# Patient Record
Sex: Female | Born: 1975 | Race: White | Hispanic: No | Marital: Single | Smoking: Never smoker
Health system: Southern US, Community
[De-identification: ages and names within clinical notes are randomized; demographics above are authoritative.]

## PROBLEM LIST (undated history)

## (undated) DIAGNOSIS — K5090 Crohn's disease, unspecified, without complications: Secondary | ICD-10-CM

## (undated) DIAGNOSIS — K219 Gastro-esophageal reflux disease without esophagitis: Secondary | ICD-10-CM

## (undated) DIAGNOSIS — D649 Anemia, unspecified: Secondary | ICD-10-CM

## (undated) DIAGNOSIS — M2550 Pain in unspecified joint: Secondary | ICD-10-CM

## (undated) DIAGNOSIS — R51 Headache: Secondary | ICD-10-CM

## (undated) DIAGNOSIS — T7840XA Allergy, unspecified, initial encounter: Secondary | ICD-10-CM

## (undated) HISTORY — DX: Crohn's disease, unspecified, without complications: K50.90

## (undated) HISTORY — DX: Anemia, unspecified: D64.9

## (undated) HISTORY — DX: Allergy, unspecified, initial encounter: T78.40XA

---

## 1998-05-17 ENCOUNTER — Other Ambulatory Visit: Admission: RE | Admit: 1998-05-17 | Discharge: 1998-05-17 | Payer: Self-pay | Admitting: Family Medicine

## 2001-05-20 ENCOUNTER — Encounter: Payer: Self-pay | Admitting: Emergency Medicine

## 2001-05-20 ENCOUNTER — Emergency Department (HOSPITAL_COMMUNITY): Admission: EM | Admit: 2001-05-20 | Discharge: 2001-05-20 | Payer: Self-pay | Admitting: Emergency Medicine

## 2009-02-25 ENCOUNTER — Inpatient Hospital Stay (HOSPITAL_COMMUNITY): Admission: AD | Admit: 2009-02-25 | Discharge: 2009-02-25 | Payer: Self-pay | Admitting: Obstetrics and Gynecology

## 2009-02-27 ENCOUNTER — Other Ambulatory Visit: Admission: RE | Admit: 2009-02-27 | Discharge: 2009-02-27 | Payer: Self-pay | Admitting: Obstetrics and Gynecology

## 2009-03-02 ENCOUNTER — Inpatient Hospital Stay (HOSPITAL_COMMUNITY): Admission: AD | Admit: 2009-03-02 | Discharge: 2009-03-02 | Payer: Self-pay | Admitting: Obstetrics and Gynecology

## 2009-03-02 ENCOUNTER — Ambulatory Visit: Payer: Self-pay | Admitting: Family Medicine

## 2009-08-28 ENCOUNTER — Ambulatory Visit: Payer: Self-pay | Admitting: Obstetrics and Gynecology

## 2009-08-28 ENCOUNTER — Inpatient Hospital Stay (HOSPITAL_COMMUNITY): Admission: AD | Admit: 2009-08-28 | Discharge: 2009-09-01 | Payer: Self-pay | Admitting: Obstetrics and Gynecology

## 2009-12-28 ENCOUNTER — Other Ambulatory Visit: Admission: RE | Admit: 2009-12-28 | Discharge: 2009-12-28 | Payer: Self-pay | Admitting: Obstetrics and Gynecology

## 2010-04-27 IMAGING — US US OB COMP LESS 14 WK
1 series · 13 of 13 positions shown · non-contrast
Comparison: None

CLINICAL DATA: 11 weeks pregnancy.  Spotting.

OBSTETRIC <14 WK ULTRASOUND
TECHNIQUE: Transabdominal ultrasound was performed for evaluation
of the gestation as well as the maternal uterus and adnexal
regions.

[Series 1: us ob comp less 14 wks · 13 of 13 slices shown]
[im 1/13]
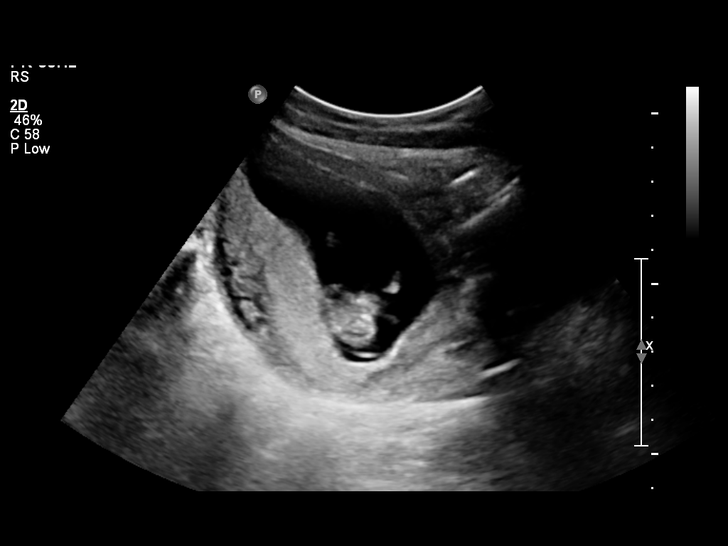
[im 2/13]
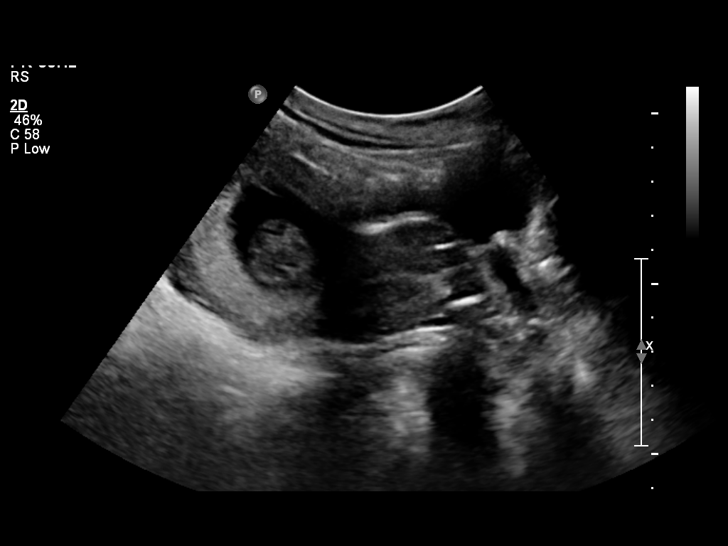
[im 3/13]
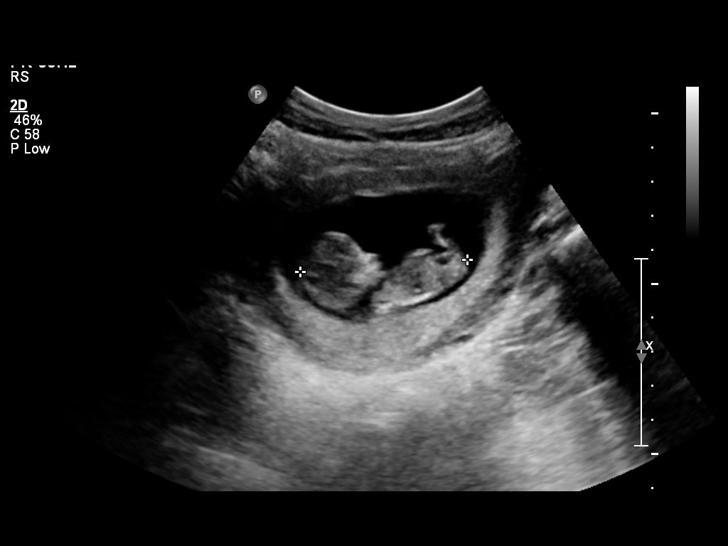
[im 4/13]
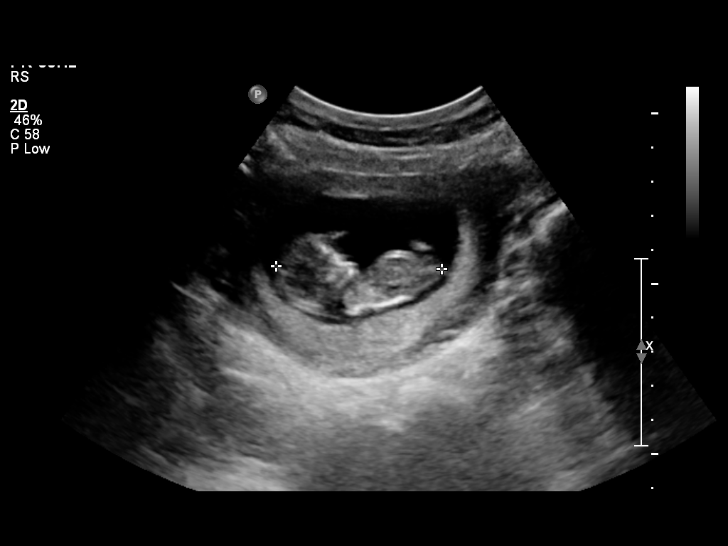
[im 5/13]
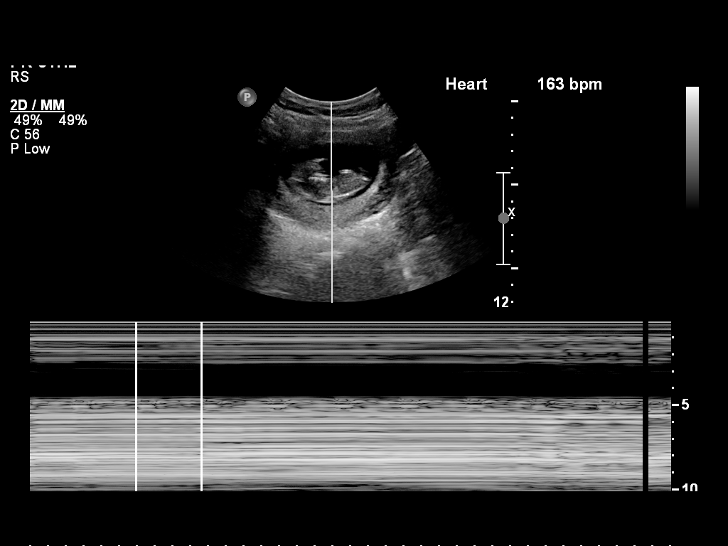
[im 6/13]
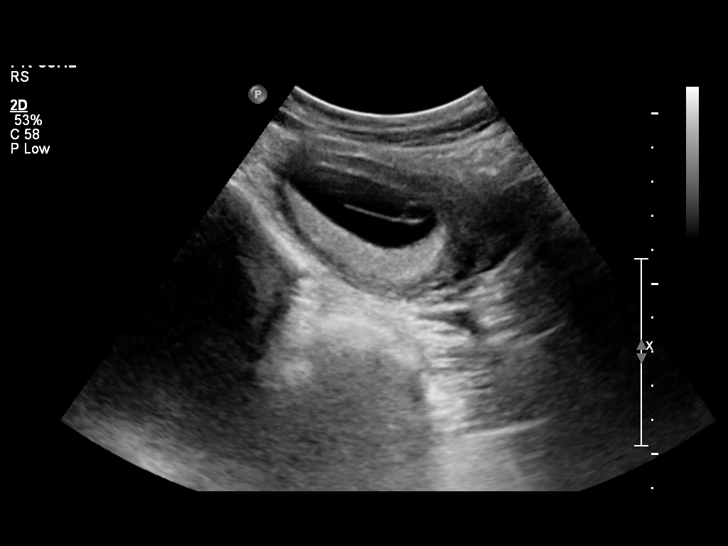
[im 7/13]
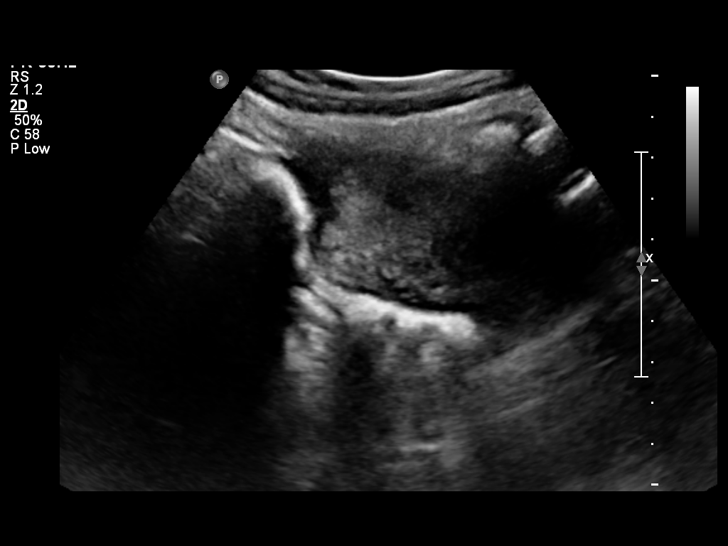
[im 8/13]
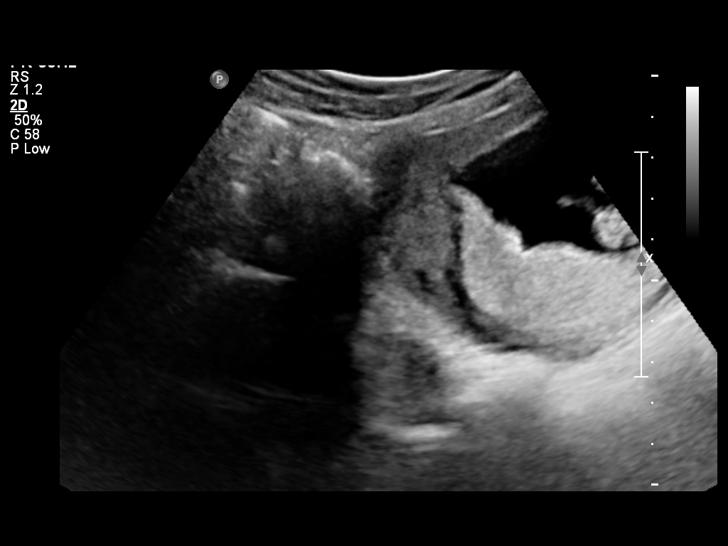
[im 9/13]
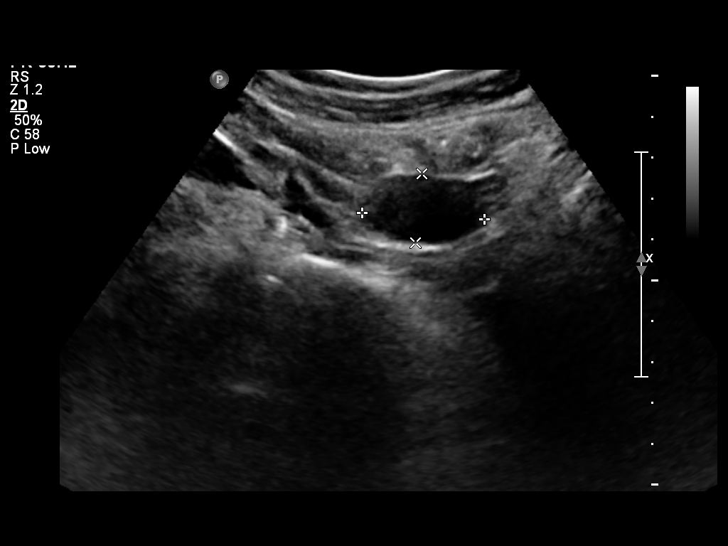
[im 10/13]
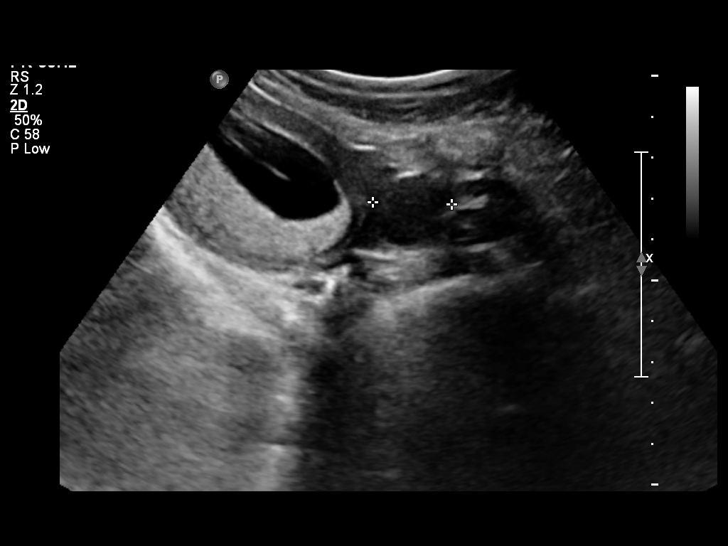
[im 11/13]
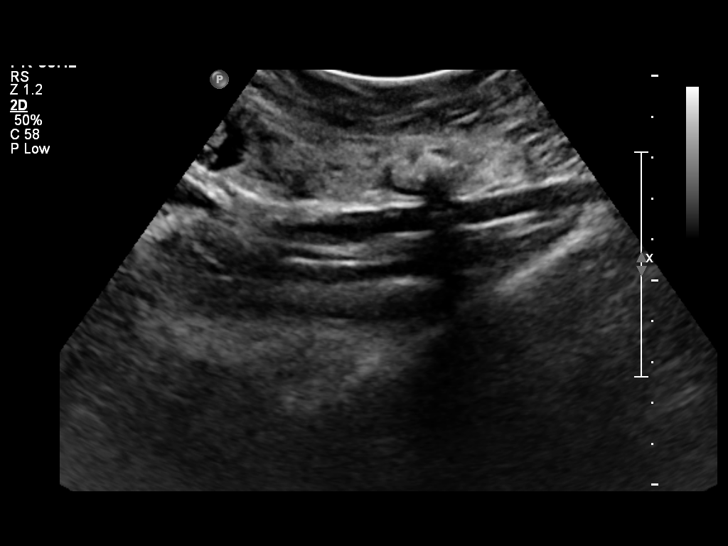
[im 12/13]
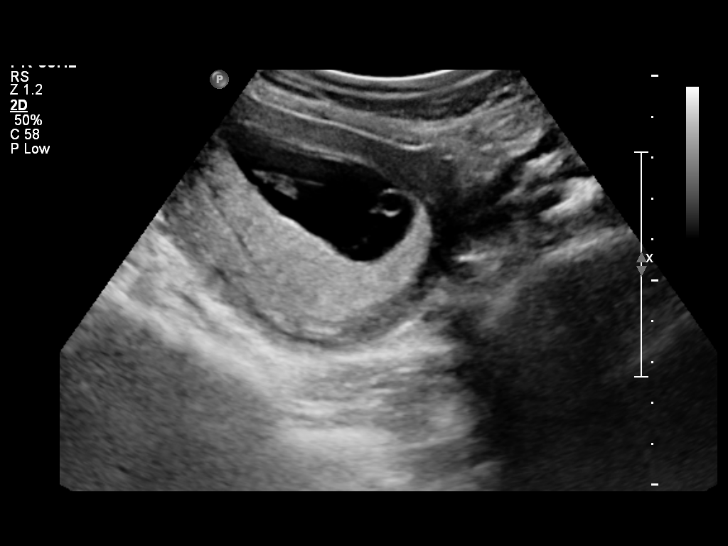
[im 13/13]
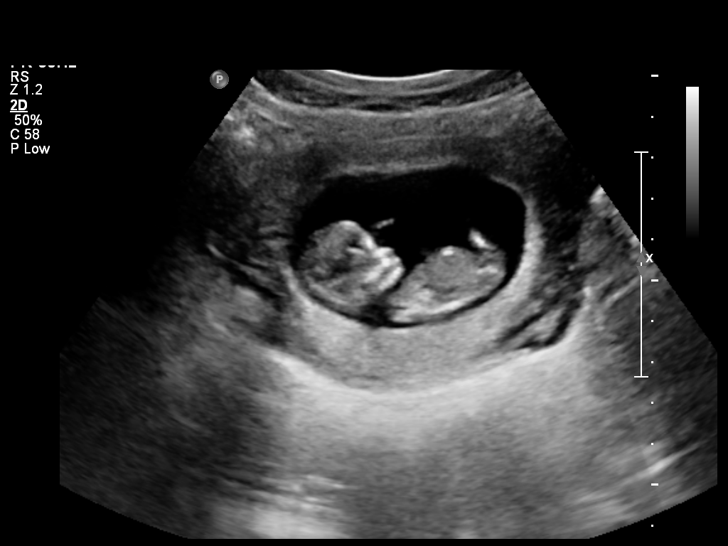

[13 of 13 positions shown; findings below may reference images not displayed]

FINDINGS: A single living intrauterine pregnancy is noted with
crown-rump length of 4.8 cm compatible with 11 weeks 5 days
gestation, and with cardiac activity at 163 beats per minute.  A
yolk sac is identified along the margin of the amnion.  The amount
of fluid appears appropriate.

No subchorionic hemorrhage is identified.  No free pelvic fluid is
noted.

The left ovary measures 3.0 x 1.7 x 1.9 cm.  The right ovary cannot
be well visualized on today's exam.
IMPRESSION: 1.  Single living intrauterine pregnancy measuring at 11 weeks 5
days gestation.  No subchorionic hemorrhage or specific
complicating feature is identified sonographically.

## 2010-06-28 ENCOUNTER — Other Ambulatory Visit: Admission: RE | Admit: 2010-06-28 | Discharge: 2010-06-28 | Payer: Self-pay | Admitting: Obstetrics and Gynecology

## 2011-02-28 ENCOUNTER — Other Ambulatory Visit (HOSPITAL_COMMUNITY)
Admission: RE | Admit: 2011-02-28 | Discharge: 2011-02-28 | Disposition: A | Discharge status: Home / Self Care | Payer: Self-pay | Source: Ambulatory Visit | Attending: Obstetrics and Gynecology | Admitting: Obstetrics and Gynecology

## 2011-02-28 ENCOUNTER — Other Ambulatory Visit: Payer: Self-pay | Admitting: Obstetrics and Gynecology

## 2011-02-28 DIAGNOSIS — Z01419 Encounter for gynecological examination (general) (routine) without abnormal findings: Secondary | ICD-10-CM | POA: Insufficient documentation

## 2011-03-22 LAB — COMPREHENSIVE METABOLIC PANEL
ALT: 8 U/L (ref 0–35)
ALT: 9 U/L (ref 0–35)
AST: 25 U/L (ref 0–37)
AST: 26 U/L (ref 0–37)
Albumin: 2.8 g/dL — ABNORMAL LOW (ref 3.5–5.2)
Alkaline Phosphatase: 214 U/L — ABNORMAL HIGH (ref 39–117)
BUN: 11 mg/dL (ref 6–23)
BUN: 15 mg/dL (ref 6–23)
CO2: 20 mEq/L (ref 19–32)
Calcium: 8.3 mg/dL — ABNORMAL LOW (ref 8.4–10.5)
Calcium: 9 mg/dL (ref 8.4–10.5)
Calcium: 9 mg/dL (ref 8.4–10.5)
Chloride: 106 mEq/L (ref 96–112)
Creatinine, Ser: 0.89 mg/dL (ref 0.4–1.2)
Creatinine, Ser: 0.91 mg/dL (ref 0.4–1.2)
Creatinine, Ser: 0.92 mg/dL (ref 0.4–1.2)
GFR calc Af Amer: >60 mL/min (ref >60)
GFR calc Af Amer: >60 mL/min (ref >60)
GFR calc non Af Amer: >60 mL/min (ref >60)
Glucose, Bld: 65 mg/dL — ABNORMAL LOW (ref 70–99)
Glucose, Bld: 67 mg/dL — ABNORMAL LOW (ref 70–99)
Glucose, Bld: 75 mg/dL (ref 70–99)
Potassium: 3.8 mEq/L (ref 3.5–5.1)
Sodium: 133 mEq/L — ABNORMAL LOW (ref 135–145)
Sodium: 135 mEq/L (ref 135–145)
Total Bilirubin: 0.3 mg/dL (ref 0.3–1.2)
Total Protein: 4.8 g/dL — ABNORMAL LOW (ref 6.0–8.3)
Total Protein: 6.3 g/dL (ref 6.0–8.3)
Total Protein: 6.3 g/dL (ref 6.0–8.3)

## 2011-03-22 LAB — DIFFERENTIAL
Eosinophils Absolute: 0 10*3/uL (ref 0.0–0.7)
Lymphocytes Relative: 12 % (ref 12–46)
Lymphs Abs: 1.5 10*3/uL (ref 0.7–4.0)
Monocytes Relative: 7 % (ref 3–12)
Neutrophils Relative %: 80 % — ABNORMAL HIGH (ref 43–77)

## 2011-03-22 LAB — CBC
HCT: 28.1 % — ABNORMAL LOW (ref 36.0–46.0)
HCT: 31.2 % — ABNORMAL LOW (ref 36.0–46.0)
Hemoglobin: 10.7 g/dL — ABNORMAL LOW (ref 12.0–15.0)
Hemoglobin: 9.6 g/dL — ABNORMAL LOW (ref 12.0–15.0)
MCHC: 34 g/dL (ref 30.0–36.0)
MCHC: 34.1 g/dL (ref 30.0–36.0)
MCHC: 34.2 g/dL (ref 30.0–36.0)
MCV: 84.2 fL (ref 78.0–100.0)
MCV: 84.7 fL (ref 78.0–100.0)
MCV: 85.2 fL (ref 78.0–100.0)
Platelets: 128 10*3/uL — ABNORMAL LOW (ref 150–400)
RBC: 3.71 MIL/uL — ABNORMAL LOW (ref 3.87–5.11)
RDW: 13.6 % (ref 11.5–15.5)
RDW: 13.8 % (ref 11.5–15.5)
RDW: 14.1 % (ref 11.5–15.5)
WBC: 7.6 10*3/uL (ref 4.0–10.5)

## 2011-03-22 LAB — URINALYSIS, MICROSCOPIC ONLY
Ketones, ur: NEGATIVE mg/dL
Protein, ur: NEGATIVE mg/dL
Urobilinogen, UA: 0.2 mg/dL (ref 0.0–1.0)

## 2011-03-22 LAB — RH IMMUNE GLOB WKUP(>/=20WKS)(NOT WOMEN'S HOSP): Fetal Screen: NEGATIVE

## 2011-03-22 LAB — RPR: RPR Ser Ql: NONREACTIVE

## 2011-03-28 LAB — COMPREHENSIVE METABOLIC PANEL
Albumin: 4.1 g/dL (ref 3.5–5.2)
Alkaline Phosphatase: 37 U/L — ABNORMAL LOW (ref 39–117)
BUN: 6 mg/dL (ref 6–23)
CO2: 24 mEq/L (ref 19–32)
Chloride: 103 mEq/L (ref 96–112)
Creatinine, Ser: 0.72 mg/dL (ref 0.4–1.2)
GFR calc non Af Amer: >60 mL/min (ref >60)
Glucose, Bld: 81 mg/dL (ref 70–99)
Potassium: 3.7 mEq/L (ref 3.5–5.1)
Total Bilirubin: 0.8 mg/dL (ref 0.3–1.2)

## 2011-03-28 LAB — RH IMMUNE GLOBULIN WORKUP (NOT WOMEN'S HOSP): Antibody Screen: NEGATIVE

## 2011-03-28 LAB — CBC
HCT: 39.3 % (ref 36.0–46.0)
MCHC: 34.2 g/dL (ref 30.0–36.0)
MCV: 86.5 fL (ref 78.0–100.0)
RBC: 4.55 MIL/uL (ref 3.87–5.11)
WBC: 6.3 10*3/uL (ref 4.0–10.5)

## 2011-03-28 LAB — HCG, QUANTITATIVE, PREGNANCY: hCG, Beta Chain, Quant, S: 147928 m[IU]/mL — ABNORMAL HIGH (ref <5)

## 2011-03-28 LAB — WET PREP, GENITAL: Clue Cells Wet Prep HPF POC: NONE SEEN

## 2011-03-28 LAB — GC/CHLAMYDIA PROBE AMP, GENITAL
Chlamydia, DNA Probe: NEGATIVE
GC Probe Amp, Genital: NEGATIVE

## 2011-03-28 LAB — ABO/RH: ABO/RH(D): A NEG

## 2013-07-05 ENCOUNTER — Ambulatory Visit (INDEPENDENT_AMBULATORY_CARE_PROVIDER_SITE_OTHER): Payer: Managed Care, Other (non HMO) | Admitting: Physician Assistant

## 2013-07-05 VITALS — BP 118/74 | HR 95 | Temp 98.4°F | Resp 18 | Ht 63.5 in | Wt 141.0 lb

## 2013-07-05 DIAGNOSIS — J069 Acute upper respiratory infection, unspecified: Secondary | ICD-10-CM

## 2013-07-05 DIAGNOSIS — K5090 Crohn's disease, unspecified, without complications: Secondary | ICD-10-CM | POA: Insufficient documentation

## 2013-07-05 DIAGNOSIS — R51 Headache: Secondary | ICD-10-CM | POA: Insufficient documentation

## 2013-07-05 DIAGNOSIS — R519 Headache, unspecified: Secondary | ICD-10-CM | POA: Insufficient documentation

## 2013-07-05 MED ORDER — GUAIFENESIN ER 1200 MG PO TB12: 1.0000 | ORAL_TABLET | Freq: Two times a day (BID) | ORAL | Status: DC | PRN

## 2013-07-05 MED ORDER — IPRATROPIUM BROMIDE 0.03 % NA SOLN: 2.0000 | Freq: Two times a day (BID) | NASAL | Status: DC

## 2013-07-05 MED ORDER — BENZONATATE 100 MG PO CAPS: 100.0000 mg | ORAL_CAPSULE | Freq: Three times a day (TID) | ORAL | Status: DC | PRN

## 2013-07-05 NOTE — Progress Notes (Signed)
  Subjective:    Patient ID: Amber Fox, female    DOB: May 21, 1976, 37 y.o.   MRN: [id number]  HPI  This 37 y.o. female presents for evaluation of several days of URI-type symptoms.  Symptoms were initially mild, but last night became significantly worse.  She feels achy all over, has a scratchy throat, ear pressure and fullness but not ear pain, nasal and sinus pressure and drainage, and cough.  No fever, chills, nausea, vomiting, stool or urinary changes.  No rash.  Past medical history, surgical history, family history, social history and problem list reviewed.   Review of Systems As above.    Objective:   Physical Exam Blood pressure 118/74, pulse 95, temperature 98.4 F (36.9 C), temperature source Oral, resp. rate 18, height 5' 3.5" (1.613 m), weight 141 lb (63.957 kg), last menstrual period 06/14/2013, SpO2 98.00%. Body mass index is 24.58 kg/(m^2). Well-developed, well nourished WF who is awake, alert and oriented, in NAD. HEENT: [place]/AT, PERRL, EOMI.  Sclera and conjunctiva are clear.  EAC are patent, TMs are normal in appearance. Nasal mucosa is congested, pink and moist. OP is clear. Neck: supple, non-tender, no lymphadenopathy, thyromegaly. Heart: RRR, no murmur Lungs: normal effort, CTA Extremities: no cyanosis, clubbing or edema. Skin: warm and dry without rash. Psychologic: good mood and appropriate affect, normal speech and behavior.        Assessment & Plan:  Viral URI with cough - Plan: benzonatate (TESSALON) 100 MG capsule, ipratropium (ATROVENT) 0.03 % nasal spray, Guaifenesin (MUCINEX MAXIMUM STRENGTH) 1200 MG TB12  Supportive care.  Anticipatory guidance.  RTC if symptoms worsen/persist.  Fernande Bras, PA-C Physician Assistant-Certified [facility]

## 2013-07-05 NOTE — Patient Instructions (Signed)
Get plenty of rest and drink at least 64 ounces of water daily. 

## 2014-02-24 ENCOUNTER — Telehealth: Payer: Self-pay

## 2014-02-24 NOTE — Telephone Encounter (Signed)
Pt advised to RTC for evaluation for a referral. Pt understands.

## 2014-02-24 NOTE — Telephone Encounter (Signed)
Pt is wanting to know if she needs to be seen or can we just send her to an ortho office   Best number [phone]

## 2014-02-28 ENCOUNTER — Ambulatory Visit (INDEPENDENT_AMBULATORY_CARE_PROVIDER_SITE_OTHER): Payer: Managed Care, Other (non HMO) | Admitting: Internal Medicine

## 2014-02-28 VITALS — BP 106/72 | HR 76 | Temp 98.8°F | Resp 16 | Ht 63.0 in | Wt 142.0 lb

## 2014-02-28 DIAGNOSIS — IMO0002 Reserved for concepts with insufficient information to code with codable children: Secondary | ICD-10-CM

## 2014-02-28 DIAGNOSIS — M5430 Sciatica, unspecified side: Secondary | ICD-10-CM

## 2014-02-28 DIAGNOSIS — M5416 Radiculopathy, lumbar region: Secondary | ICD-10-CM

## 2014-02-28 NOTE — Progress Notes (Signed)
   Subjective:    Patient ID: Amber Fox, female    DOB: April 01, 1976, 38 y.o.   MRN: [id number]  HPI 38 year old female with cc of back pain for one year . Lower back pain radiates down her right leg causes her to crump and sometimes almost fall and not be able to get up without pain but has not injured her back recently. Was seen by ortho 1 year ago and went for physical therapy is requesting to get a referral to another orthopedic group fo  r re evaluation. Also has a hx of chr ones disease which is not presently advice and she does not get andy treatment of have a dr for her Chrones disease. Is not having any diarrhea does have intermittent abdominal pain but this is not unusual for her. Pain in back is moderate in severity increased with motion radiates down her right leg. Pt does not want to take any pain meds for her back pain and she does not wan tanother referral fo rphysical therapy. She is requesting a referral to Dr Jillyn HiddenBean..   Review of Systems  Musculoskeletal: Positive for back pain.  All other systems reviewed and are negative.       Objective:   Physical Exam  Nursing note and vitals reviewed. Constitutional: She is oriented to person, place, and time. She appears well-developed and well-nourished.  HENT:  Head: Normocephalic and atraumatic.  Right Ear: External ear normal.  Left Ear: External ear normal.  Eyes: Conjunctivae and EOM are normal. Pupils are equal, round, and reactive to light.  Neck: Normal range of motion. Neck supple.  Cardiovascular: Normal rate, regular rhythm and normal heart sounds.   Pulmonary/Chest: Effort normal and breath sounds normal.  Abdominal: Soft.  Musculoskeletal: Normal range of motion. She exhibits no tenderness.  Neurological: She is alert and oriented to person, place, and time.  Skin: Skin is warm and dry.  Psychiatric: She has a normal mood and affect. Her behavior is normal. Judgment and thought content normal.            Assessment & Plan:  38 year old woman with chronic back pain and chrones disease in remission requesting otho referral to dr bean. Doesot want any medication for the pain, has had xray as one year ago by [facility], does not want a referral to physical therapy. Will refer to dr bean.

## 2014-05-04 ENCOUNTER — Other Ambulatory Visit: Payer: Self-pay | Admitting: Specialist

## 2014-05-04 DIAGNOSIS — M25551 Pain in right hip: Secondary | ICD-10-CM

## 2014-05-04 DIAGNOSIS — M25552 Pain in left hip: Principal | ICD-10-CM

## 2014-05-11 ENCOUNTER — Ambulatory Visit
Admission: RE | Admit: 2014-05-11 | Discharge: 2014-05-11 | Disposition: A | Discharge status: Home / Self Care | Payer: 59 | Source: Ambulatory Visit | Attending: Specialist | Admitting: Specialist

## 2014-05-11 DIAGNOSIS — M25552 Pain in left hip: Principal | ICD-10-CM

## 2014-05-11 DIAGNOSIS — M25551 Pain in right hip: Secondary | ICD-10-CM

## 2014-05-11 MED ORDER — IOHEXOL 180 MG/ML  SOLN: 1.0000 mL | Freq: Once | INTRAMUSCULAR | Status: AC | PRN

## 2014-05-11 MED ORDER — METHYLPREDNISOLONE ACETATE 40 MG/ML INJ SUSP (RADIOLOG: 120.0000 mg | Freq: Once | INTRAMUSCULAR | Status: DC

## 2014-05-11 NOTE — Discharge Instructions (Signed)

## 2014-05-11 NOTE — Progress Notes (Signed)
Dr. Karin Golden in to speak with patient after SI joint injection.  Will be back to reassess patient shortly.  jkl

## 2014-05-12 ENCOUNTER — Other Ambulatory Visit: Payer: Self-pay | Admitting: Gastroenterology

## 2014-05-16 ENCOUNTER — Encounter (HOSPITAL_COMMUNITY): Payer: Self-pay | Admitting: Pharmacy Technician

## 2014-05-20 ENCOUNTER — Encounter (HOSPITAL_COMMUNITY): Payer: Self-pay | Admitting: *Deleted

## 2014-05-24 ENCOUNTER — Ambulatory Visit (HOSPITAL_COMMUNITY)
Admission: RE | Admit: 2014-05-24 | Discharge: 2014-05-24 | Disposition: A | Discharge status: Home / Self Care | Payer: PRIVATE HEALTH INSURANCE | Source: Ambulatory Visit | Attending: Gastroenterology | Admitting: Gastroenterology

## 2014-05-24 ENCOUNTER — Ambulatory Visit (HOSPITAL_COMMUNITY): Payer: PRIVATE HEALTH INSURANCE | Admitting: Anesthesiology

## 2014-05-24 ENCOUNTER — Encounter (HOSPITAL_COMMUNITY): Payer: PRIVATE HEALTH INSURANCE | Admitting: Anesthesiology

## 2014-05-24 ENCOUNTER — Encounter (HOSPITAL_COMMUNITY)
Admission: RE | Disposition: A | Discharge status: Home / Self Care | Payer: Self-pay | Source: Ambulatory Visit | Attending: Gastroenterology

## 2014-05-24 ENCOUNTER — Encounter (HOSPITAL_COMMUNITY): Payer: Self-pay | Admitting: *Deleted

## 2014-05-24 DIAGNOSIS — M129 Arthropathy, unspecified: Secondary | ICD-10-CM | POA: Insufficient documentation

## 2014-05-24 DIAGNOSIS — K921 Melena: Secondary | ICD-10-CM | POA: Insufficient documentation

## 2014-05-24 DIAGNOSIS — R109 Unspecified abdominal pain: Secondary | ICD-10-CM | POA: Insufficient documentation

## 2014-05-24 HISTORY — DX: Gastro-esophageal reflux disease without esophagitis: K21.9

## 2014-05-24 HISTORY — PX: COLONOSCOPY WITH PROPOFOL: SHX5780

## 2014-05-24 HISTORY — DX: Pain in unspecified joint: M25.50

## 2014-05-24 HISTORY — DX: Headache: R51

## 2014-05-24 SURGERY — COLONOSCOPY WITH PROPOFOL
Anesthesia: Monitor Anesthesia Care

## 2014-05-24 MED ORDER — LIDOCAINE HCL (CARDIAC) 20 MG/ML IV SOLN
INTRAVENOUS | Status: DC | PRN
  Administered 2014-05-24: 50 mg via INTRAVENOUS

## 2014-05-24 MED ORDER — PROPOFOL 10 MG/ML IV BOLUS
INTRAVENOUS | Status: AC
  Filled 2014-05-24: qty 20

## 2014-05-24 MED ORDER — LACTATED RINGERS IV SOLN
INTRAVENOUS | Status: DC
  Administered 2014-05-24: 1000 mL via INTRAVENOUS

## 2014-05-24 MED ORDER — LIDOCAINE HCL (CARDIAC) 20 MG/ML IV SOLN
INTRAVENOUS | Status: AC
  Filled 2014-05-24: qty 5

## 2014-05-24 MED ORDER — PROPOFOL INFUSION 10 MG/ML OPTIME
INTRAVENOUS | Status: DC | PRN
  Administered 2014-05-24: 140 ug/kg/min via INTRAVENOUS

## 2014-05-24 MED ORDER — LACTATED RINGERS IV SOLN
INTRAVENOUS | Status: DC | PRN
  Administered 2014-05-24: 10:00:00 via INTRAVENOUS

## 2014-05-24 MED ORDER — FENTANYL CITRATE 0.05 MG/ML IJ SOLN
25.0000 ug | INTRAMUSCULAR | Status: DC | PRN
Start: 2014-05-24 — End: 2014-05-24

## 2014-05-24 MED ORDER — SODIUM CHLORIDE 0.9 % IV SOLN: INTRAVENOUS | Status: DC

## 2014-05-24 SURGICAL SUPPLY — 22 items

## 2014-05-24 NOTE — Op Note (Signed)
Problem: Hematochezia  Endoscopist: Danise Edge  Premedication: Propofol administered by anesthesia  Procedure: Diagnostic colonoscopy Anal inspection and digital rectal exam were normal. The Pentax pediatric colonoscope was introduced into the rectum and advanced to the cecum. A normal appearing ileocecal valve was intubated and the terminal ileum inspected. Colonic preparation for the exam today was good.  Rectum. Normal. Retroflexed view of the distal rectum normal  Sigmoid colon and descending colon. Normal  Splenic flexure. Normal  Transverse colon. Normal  Hepatic flexure. Normal  Ascending colon. Normal  Cecum and ileocecal valve. Normal  Terminal ileum. Normal  Assessment: Normal diagnostic proctocolonoscopy to the cecum with intubation of a normal-appearing ileocecal valve and inspection of the terminal ileum. There were no signs of chronic inflammatory bowel disease.  Recommendation: Schedule screening colonoscopy at age 74

## 2014-05-24 NOTE — H&P (Signed)
  Problem: Hematochezia  History: The patient is a 38 year old female born 29-Mar-1976. She has arthritis and intermittent hematochezia associated with lower abdominal cramps.  The patient tells me she developed abdominal pain, diarrhea, and gastrointestinal bleeding when she was 38 years old. She was evaluated by [facility] gastroenterologist. The patient tells me she underwent a barium enema x-ray, CT scan of the abdomen, and colonoscopy. She was diagnosed with Crohn's disease and placed on asacol plus prednisone. She took prednisone for one year before stopping prednisone. She has not taken medication for inflammatory bowel disease since she was 38 years old. She has not required intestinal surgery.  The patient denies nausea, vomiting, or diarrhea.  The patient is scheduled to undergo a diagnostic colonoscopy.  Medication allergies: Sulfa  Past medical history: Cesarean section.  Exam: The patient is alert and lying comfortably on the endoscopy stretcher. She does have a headache. Abdomen is soft and nontender to palpation. Lungs are clear to auscultation. Cardiac exam reveals a regular rhythm.  Plan: Proceed with diagnostic colonoscopy.

## 2014-05-24 NOTE — Discharge Instructions (Signed)
Colonoscopy °A colonoscopy is an exam to look at the entire large intestine (colon). This exam can help find problems such as tumors, polyps, inflammation, and areas of bleeding. The exam takes about 1 hour.  °LET YOUR HEALTH CARE PROVIDER KNOW ABOUT:  °· Any allergies you have. °· All medicines you are taking, including vitamins, herbs, eye drops, creams, and over-the-counter medicines. °· Previous problems you or members of your family have had with the use of anesthetics. °· Any blood disorders you have. °· Previous surgeries you have had. °· Medical conditions you have. °RISKS AND COMPLICATIONS  °Generally, this is a safe procedure. However, as with any procedure, complications can occur. Possible complications include: °· Bleeding. °· Tearing or rupture of the colon wall. °· Reaction to medicines given during the exam. °· Infection (rare). °BEFORE THE PROCEDURE  °· Ask your health care provider about changing or stopping your regular medicines. °· You may be prescribed an oral bowel prep. This involves drinking a large amount of medicated liquid, starting the day before your procedure. The liquid will cause you to have multiple loose stools until your stool is almost clear or light green. This cleans out your colon in preparation for the procedure. °· Do not eat or drink anything else once you have started the bowel prep, unless your health care provider tells you it is safe to do so. °· Arrange for someone to drive you home after the procedure. °PROCEDURE  °· You will be given medicine to help you relax (sedative). °· You will lie on your side with your knees bent. °· A long, flexible tube with a light and camera on the end (colonoscope) will be inserted through the rectum and into the colon. The camera sends video back to a computer screen as it moves through the colon. The colonoscope also releases carbon dioxide gas to inflate the colon. This helps your health care provider see the area better. °· During  the exam, your health care provider may take a small tissue sample (biopsy) to be examined under a microscope if any abnormalities are found. °· The exam is finished when the entire colon has been viewed. °AFTER THE PROCEDURE  °· Do not drive for 24 hours after the exam. °· You may have a small amount of blood in your stool. °· You may pass moderate amounts of gas and have mild abdominal cramping or bloating. This is caused by the gas used to inflate your colon during the exam. °· Ask when your test results will be ready and how you will get your results. Make sure you get your test results. °Document Released: 11/29/2000 Document Revised: 09/22/2013 Document Reviewed: 08/09/2013 °ExitCare® Patient Information ©2014 [employer]. °Monitored Anesthesia Care  °Monitored anesthesia care is an anesthesia service for a medical procedure. Anesthesia is the loss of the ability to feel pain. It is produced by medications called anesthetics. It may affect a small area of your body (local anesthesia), a large area of your body (regional anesthesia), or your entire body (general anesthesia). The need for monitored anesthesia care depends your procedure, your condition, and the potential need for regional or general anesthesia. It is often provided during procedures where:  °· General anesthesia may be needed if there are complications. This is because you need special care when you are under general anesthesia.   °· You will be under local or regional anesthesia. This is so that you are able to have higher levels of anesthesia if needed.   °· You   will receive calming medications (sedatives). This is especially the case if sedatives are given to put you in a semi-conscious state of relaxation (deep sedation). This is because the amount of sedative needed to produce this state can be hard to predict. Too much of a sedative can produce general anesthesia. °Monitored anesthesia care is performed by one or more caregivers who have  special training in all types of anesthesia. You will need to meet with these caregivers before your procedure. During this meeting, they will ask you about your medical history. They will also give you instructions to follow. (For example, you will need to stop eating and drinking before your procedure. You may also need to stop or change medications you are taking.) During your procedure, your caregivers will stay with you. They will:  °· Watch your condition. This includes watching you blood pressure, breathing, and level of pain.   °· Diagnose and treat problems that occur.   °· Give medications if they are needed. These may include calming medications (sedatives) and anesthetics.   °· Make sure you are comfortable.   °Having monitored anesthesia care does not necessarily mean that you will be under anesthesia. It does mean that your caregivers will be able to manage anesthesia if you need it or if it occurs. It also means that you will be able to have a different type of anesthesia than you are having if you need it. When your procedure is complete, your caregivers will continue to watch your condition. They will make sure any medications wear off before you are allowed to go home.  °Document Released: 08/28/2005 Document Revised: 03/29/2013 Document Reviewed: 01/13/2013 °ExitCare® Patient Information ©2014 [employer]. ° °

## 2014-05-24 NOTE — Anesthesia Postprocedure Evaluation (Signed)
  Anesthesia Post-op Note  Patient: Amber Fox  Procedure(s) Performed: Procedure(s) (LRB): COLONOSCOPY WITH PROPOFOL (N/A)  Patient Location: PACU  Anesthesia Type: MAC  Level of Consciousness: awake and alert   Airway and Oxygen Therapy: Patient Spontanous Breathing  Post-op Pain: mild  Post-op Assessment: Post-op Vital signs reviewed, Patient's Cardiovascular Status Stable, Respiratory Function Stable, Patent Airway and No signs of Nausea or vomiting  Last Vitals:  Filed Vitals:   05/24/14 1131  BP: 109/67  Pulse:   Temp:   Resp:     Post-op Vital Signs: stable   Complications: No apparent anesthesia complications

## 2014-05-24 NOTE — Transfer of Care (Signed)
Immediate Anesthesia Transfer of Care Note  Patient: Amber Fox  Procedure(s) Performed: Procedure(s): COLONOSCOPY WITH PROPOFOL (N/A)  Patient Location: PACU  Anesthesia Type:MAC  Level of Consciousness: sedated  Airway & Oxygen Therapy: Patient Spontanous Breathing and Patient connected to face mask oxygen  Post-op Assessment: Report given to PACU RN and Post -op Vital signs reviewed and stable  Post vital signs: Reviewed and stable  Complications: No apparent anesthesia complications

## 2014-05-24 NOTE — Anesthesia Preprocedure Evaluation (Signed)
Anesthesia Evaluation  Patient identified by MRN, date of birth, ID band Patient awake    Reviewed: Allergy & Precautions, H&P , NPO status , Patient's Chart, lab work & pertinent test results  Airway Mallampati: II TM Distance: >3 FB Neck ROM: full    Dental no notable dental hx. (+) Teeth Intact, Dental Advisory Given   Pulmonary neg pulmonary ROS,  breath sounds clear to auscultation  Pulmonary exam normal       Cardiovascular Exercise Tolerance: Good negative cardio ROS  Rhythm:regular Rate:Normal     Neuro/Psych negative neurological ROS  negative psych ROS   GI/Hepatic negative GI ROS, Neg liver ROS, Crohn's disease   Endo/Other  negative endocrine ROS  Renal/GU negative Renal ROS  negative genitourinary   Musculoskeletal   Abdominal   Peds  Hematology negative hematology ROS (+)   Anesthesia Other Findings   Reproductive/Obstetrics negative OB ROS                           Anesthesia Physical Anesthesia Plan  ASA: II  Anesthesia Plan: MAC   Post-op Pain Management:    Induction:   Airway Management Planned:   Additional Equipment:   Intra-op Plan:   Post-operative Plan:   Informed Consent: I have reviewed the patients History and Physical, chart, labs and discussed the procedure including the risks, benefits and alternatives for the proposed anesthesia with the patient or authorized representative who has indicated his/her understanding and acceptance.   Dental Advisory Given  Plan Discussed with: CRNA and Surgeon  Anesthesia Plan Comments:         Anesthesia Quick Evaluation

## 2014-05-25 ENCOUNTER — Encounter (HOSPITAL_COMMUNITY): Payer: Self-pay | Admitting: Gastroenterology

## 2014-07-28 ENCOUNTER — Other Ambulatory Visit (HOSPITAL_COMMUNITY)
Admission: RE | Admit: 2014-07-28 | Discharge: 2014-07-28 | Disposition: A | Discharge status: Home / Self Care | Payer: PRIVATE HEALTH INSURANCE | Source: Ambulatory Visit | Attending: Obstetrics and Gynecology | Admitting: Obstetrics and Gynecology

## 2014-07-28 ENCOUNTER — Other Ambulatory Visit: Payer: Self-pay | Admitting: Obstetrics and Gynecology

## 2014-07-28 DIAGNOSIS — Z01419 Encounter for gynecological examination (general) (routine) without abnormal findings: Secondary | ICD-10-CM | POA: Insufficient documentation

## 2014-07-28 DIAGNOSIS — Z1151 Encounter for screening for human papillomavirus (HPV): Secondary | ICD-10-CM | POA: Diagnosis present

## 2014-08-02 LAB — CYTOLOGY - PAP

## 2016-04-29 ENCOUNTER — Ambulatory Visit (INDEPENDENT_AMBULATORY_CARE_PROVIDER_SITE_OTHER): Payer: Managed Care, Other (non HMO) | Admitting: Internal Medicine

## 2016-04-29 VITALS — BP 109/70 | HR 79 | Temp 98.5°F | Resp 16 | Ht 63.0 in | Wt 144.0 lb

## 2016-04-29 DIAGNOSIS — K5090 Crohn's disease, unspecified, without complications: Secondary | ICD-10-CM

## 2016-04-29 DIAGNOSIS — R42 Dizziness and giddiness: Secondary | ICD-10-CM | POA: Diagnosis not present

## 2016-04-29 LAB — POCT CBC
Granulocyte percent: 74.4 %G (ref 37–80)
HCT, POC: 40.1 % (ref 37.7–47.9)
Hemoglobin: 14 g/dL (ref 12.2–16.2)
LYMPH, POC: 1.8 (ref 0.6–3.4)
MCH, POC: 28.4 pg (ref 27–31.2)
MCHC: 34.9 g/dL (ref 31.8–35.4)
MCV: 81.5 fL (ref 80–97)
MID (cbc): 0.7 (ref 0–0.9)
MPV: 8.6 fL (ref 0–99.8)
POC GRANULOCYTE: 7.4 — AB (ref 2–6.9)
POC LYMPH %: 18.3 % (ref 10–50)
POC MID %: 7.3 % (ref 0–12)
Platelet Count, POC: 202 10*3/uL (ref 142–424)
RBC: 4.92 M/uL (ref 4.04–5.48)
RDW, POC: 12.8 %
WBC: 9.9 10*3/uL (ref 4.6–10.2)

## 2016-04-29 MED ORDER — MECLIZINE HCL 25 MG PO TABS: 25.0000 mg | ORAL_TABLET | Freq: Three times a day (TID) | ORAL | Status: DC | PRN

## 2016-04-29 NOTE — Progress Notes (Signed)
Subjective:  By signing my name below, I, Stann Ore, attest that this documentation has been prepared under the direction and in the presence of Ellamae Sia, MD. Electronically Signed: Stann Ore, Scribe. 04/29/2016 , 5:55 PM .  Patient was seen in Room 6 .   Patient ID: Amber Fox, female    DOB: [birth year], 40 y.o.   MRN: [id number] Chief Complaint  Patient presents with  . Dizziness    x 4 days   HPI Amber Fox is a 40 y.o. female who has h/o Crohn's disease presents to [facility] complaining of dizziness that started 4 days ago. It started out of the blue without any difference in positional changes. She reports the episodes of dizziness would be brief. She informs having 3 episodes of dizziness while in the clinic today. She's felt a little nauseous today with the dizziness. She also feels some pressure in her face. She mentions having low iron when she gave birth to her daughter 6 years ago. She denies diarrhea, blood in stool, unexpected weight change, tinnitus or seasonal allergies.   Patient also informs having shortness of breath due to hip and tailbone issues ongoing for about 2 years. 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 She denies taking any medication for this.   Patient Active Problem List   Diagnosis Date Noted  . Crohn's disease (HCC) 07/05/2013  . Headache(784.0) 07/05/2013    Current outpatient prescriptions:  .  acetaminophen (TYLENOL) 500 MG tablet, Take 1,000 mg by mouth every 6 (six) hours as needed for mild pain or moderate pain., Disp: , Rfl:  .  ibuprofen (ADVIL,MOTRIN) 200 MG tablet, Take 600-800 mg by mouth every 6 (six) hours as needed for mild pain or moderate pain., Disp: , Rfl:  Allergies  Allergen Reactions  . Iron Other (See Comments)    HEADACHE  . Sulfa Antibiotics Itching   Review of Systems  Constitutional: Negative for fever, chills, fatigue and unexpected weight change.    HENT: Negative for tinnitus.   Respiratory: Positive for shortness of breath.   Gastrointestinal: Positive for nausea. Negative for vomiting, abdominal pain, diarrhea and blood in stool.  Musculoskeletal: Positive for back pain.  Allergic/Immunologic: Negative for environmental allergies.  Neurological: Positive for dizziness.      Objective:   Physical Exam  Constitutional: She is oriented to person, place, and time. She appears well-developed and well-nourished. No distress.  HENT:  Head: Normocephalic and atraumatic.  Right Ear: Tympanic membrane normal.  Left Ear: Tympanic membrane normal.  Nose: Nose normal.  Mouth/Throat: Oropharynx is clear and moist.  Eyes: Conjunctivae and EOM are normal. Pupils are equal, round, and reactive to light.  Neck: Normal range of motion. Neck supple. No thyromegaly present.  Mild discomfort with posterior neck with forward motion  Cardiovascular: Normal rate, regular rhythm and normal heart sounds.   Pulmonary/Chest: Effort normal and breath sounds normal. No respiratory distress.  Musculoskeletal: Normal range of motion. She exhibits no edema.  Lymphadenopathy:    She has no cervical adenopathy.  Neurological: She is alert and oriented to person, place, and time. She has normal reflexes. No cranial nerve deficit. She exhibits normal muscle tone. Coordination normal.  Skin: Skin is warm and dry.  Psychiatric: She has a normal mood and affect. Her behavior is normal. Thought content normal.  Nursing note and vitals reviewed.   BP 109/70 mmHg  Pulse 79  Temp(Src) 98.5 F (36.9 C) (Oral)  Resp 16  Ht  (1.6 m)  Wt 144 lb (65.318 kg)  BMI 25.51 kg/m2  SpO2 98%  LMP 04/06/2016 (Approximate)   Results for orders placed or performed in visit on 04/29/16  POCT CBC  Result Value Ref Range   WBC 9.9 4.6 - 10.2 K/uL   Lymph, poc 1.8 0.6 - 3.4   POC LYMPH PERCENT 18.3 10 - 50 %L   MID (cbc) 0.7 0 - 0.9   POC MID % 7.3 0 - 12 %M   POC  Granulocyte 7.4 (A) 2 - 6.9   Granulocyte percent 74.4 37 - 80 %G   RBC 4.92 4.04 - 5.48 M/uL   Hemoglobin 14.0 12.2 - 16.2 g/dL   HCT, POC 16.1 09.6 - 47.9 %   MCV 81.5 80 - 97 fL   MCH, POC 28.4 27 - 31.2 pg   MCHC 34.9 31.8 - 35.4 g/dL   RDW, POC 04.5 %   Platelet Count, POC 202 142 - 424 K/uL   MPV 8.6 0 - 99.8 fL       Assessment & Plan:  I have completed the patient encounter in its entirety as documented by the scribe, with editing by me where necessary. Avishai Reihl P. Merla Riches, M.D.  Dizzy -with normal neurological exam - Plan: Comprehensive metabolic panel, TSH Meds ordered this encounter  Medications  . meclizine (ANTIVERT) 25 MG tablet    Sig: Take 1 tablet (25 mg total) by mouth 3 (three) times daily as needed for dizziness.    Dispense:  20 tablet    Refill:  0   ?role of sleep deprivation from hip pain at night F/u 4d if not resolved to consider imaging or ENT referral  Crohn disease, without complications -hgb wnl

## 2016-04-29 NOTE — Patient Instructions (Signed)
     IF you received an x-ray today, you will receive an invoice from [facility]. Please contact [facility] at [phone] with questions or concerns regarding your invoice.   IF you received labwork today, you will receive an invoice from [facility]. Please contact [facility] at [phone] with questions or concerns regarding your invoice.   Our billing staff will not be able to assist you with questions regarding bills from these companies.  You will be contacted with the lab results as soon as they are available. The fastest way to get your results is to activate your My Chart account. Instructions are located on the last page of this paperwork. If you have not heard from us regarding the results in 2 weeks, please contact this office.      

## 2016-04-30 LAB — COMPREHENSIVE METABOLIC PANEL
ALBUMIN: 4.7 g/dL (ref 3.6–5.1)
ALK PHOS: 57 U/L (ref 33–115)
ALT: 4 U/L — ABNORMAL LOW (ref 6–29)
AST: 15 U/L (ref 10–30)
BILIRUBIN TOTAL: 0.6 mg/dL (ref 0.2–1.2)
BUN: 13 mg/dL (ref 7–25)
CO2: 22 mmol/L (ref 20–31)
Calcium: 9.7 mg/dL (ref 8.6–10.2)
Chloride: 102 mmol/L (ref 98–110)
Creat: 0.95 mg/dL (ref 0.50–1.10)
GLUCOSE: 101 mg/dL — AB (ref 65–99)
POTASSIUM: 3.8 mmol/L (ref 3.5–5.3)
Sodium: 137 mmol/L (ref 135–146)
Total Protein: 7.9 g/dL (ref 6.1–8.1)

## 2016-04-30 LAB — TSH: TSH: 4.02 mIU/L

## 2016-04-30 LAB — SEDIMENTATION RATE: SED RATE: 7 mm/h (ref 0–20)

## 2016-05-11 ENCOUNTER — Encounter: Payer: Self-pay | Admitting: *Deleted

## 2016-07-10 ENCOUNTER — Other Ambulatory Visit: Payer: Self-pay | Admitting: Orthopedic Surgery

## 2016-07-24 ENCOUNTER — Encounter (HOSPITAL_COMMUNITY): Payer: Self-pay

## 2016-07-24 NOTE — Pre-Procedure Instructions (Signed)
QUIARA KILLIAN  07/24/2016      [id number] [place], [place] [phone] [place] [address] Phone: [phone] Fax: [phone]    Your procedure is scheduled on august 17  Report to [facility] Admitting at 0830 A.M.  Call this number if you have problems the morning of surgery:  [phone]   Remember:  Do not eat food or drink liquids after midnight.   Take these medicines the morning of surgery with A SIP OF WATER: acetaminophen (tylenol),   7 days prior to surgery STOP taking any Aspirin, Aleve, Naproxen, Ibuprofen, Motrin, Advil, Goody's, BC's, all herbal medications, fish oil, and all vitamins    Do not wear jewelry, make-up or nail polish.  Do not wear lotions, powders, or perfumes.  You may NOT wear deoderant.  Do not shave 48 hours prior to surgery.    Do not bring valuables to the hospital.  [facility] is not responsible for any belongings or valuables.  Contacts, dentures or bridgework may not be worn into surgery.  Leave your suitcase in the car.  After surgery it may be brought to your room.  For patients admitted to the hospital, discharge time will be determined by your treatment team.  Patients discharged the day of surgery will not be allowed to drive home.    Special instructions:   [place]- Preparing For Surgery  Before surgery, you can play an important role. Because skin is not sterile, your skin needs to be as free of germs as possible. You can reduce the number of germs on your skin by washing with CHG (chlorahexidine gluconate) Soap before surgery.  CHG is an antiseptic cleaner which kills germs and bonds with the skin to continue killing germs even after washing.  Please do not use if you have an allergy to CHG or antibacterial soaps. If your skin becomes reddened/irritated stop using the CHG.  Do not shave (including legs and underarms) for at least 48 hours prior to first CHG shower. It is OK  to shave your face.  Please follow these instructions carefully.   1. Shower the NIGHT BEFORE SURGERY and the MORNING OF SURGERY with CHG.   2. If you chose to wash your hair, wash your hair first as usual with your normal shampoo.  3. After you shampoo, rinse your hair and body thoroughly to remove the shampoo.  4. Use CHG as you would any other liquid soap. You can apply CHG directly to the skin and wash gently with a scrungie or a clean washcloth.   5. Apply the CHG Soap to your body ONLY FROM THE NECK DOWN.  Do not use on open wounds or open sores. Avoid contact with your eyes, ears, mouth and genitals (private parts). Wash genitals (private parts) with your normal soap.  6. Wash thoroughly, paying special attention to the area where your surgery will be performed.  7. Thoroughly rinse your body with warm water from the neck down.  8. DO NOT shower/wash with your normal soap after using and rinsing off the CHG Soap.  9. Pat yourself dry with a CLEAN TOWEL.   10. Wear CLEAN PAJAMAS   11. Place CLEAN SHEETS on your bed the night of your first shower and DO NOT SLEEP WITH PETS.    Day of Surgery: Do not apply any deodorants/lotions. Please wear clean clothes to the hospital/surgery center.      Please read over the  following fact sheets that you were given. Coughing and Deep Breathing and MRSA Information

## 2016-07-25 ENCOUNTER — Ambulatory Visit (HOSPITAL_COMMUNITY)
Admission: RE | Admit: 2016-07-25 | Discharge: 2016-07-25 | Disposition: A | Discharge status: Home / Self Care | Payer: 59 | Source: Ambulatory Visit | Attending: Orthopedic Surgery | Admitting: Orthopedic Surgery

## 2016-07-25 ENCOUNTER — Encounter (HOSPITAL_COMMUNITY): Payer: Self-pay

## 2016-07-25 ENCOUNTER — Encounter (HOSPITAL_COMMUNITY)
Admission: RE | Admit: 2016-07-25 | Discharge: 2016-07-25 | Disposition: A | Discharge status: Home / Self Care | Payer: 59 | Source: Ambulatory Visit | Attending: Orthopedic Surgery | Admitting: Orthopedic Surgery

## 2016-07-25 DIAGNOSIS — Z01812 Encounter for preprocedural laboratory examination: Secondary | ICD-10-CM | POA: Diagnosis not present

## 2016-07-25 DIAGNOSIS — R001 Bradycardia, unspecified: Secondary | ICD-10-CM | POA: Insufficient documentation

## 2016-07-25 DIAGNOSIS — Z01818 Encounter for other preprocedural examination: Secondary | ICD-10-CM | POA: Diagnosis not present

## 2016-07-25 DIAGNOSIS — M533 Sacrococcygeal disorders, not elsewhere classified: Secondary | ICD-10-CM | POA: Insufficient documentation

## 2016-07-25 LAB — URINALYSIS, ROUTINE W REFLEX MICROSCOPIC
Bilirubin Urine: NEGATIVE
Glucose, UA: NEGATIVE mg/dL
Hgb urine dipstick: NEGATIVE
Ketones, ur: NEGATIVE mg/dL
LEUKOCYTES UA: NEGATIVE
Nitrite: NEGATIVE
PROTEIN: NEGATIVE mg/dL
SPECIFIC GRAVITY, URINE: 1.011 (ref 1.005–1.030)
pH: 5.5 (ref 5.0–8.0)

## 2016-07-25 LAB — CBC WITH DIFFERENTIAL/PLATELET
BASOS ABS: 0 10*3/uL (ref 0.0–0.1)
BASOS PCT: 1 %
EOS ABS: 0.1 10*3/uL (ref 0.0–0.7)
EOS PCT: 1 %
HCT: 43.2 % (ref 36.0–46.0)
Hemoglobin: 14 g/dL (ref 12.0–15.0)
LYMPHS PCT: 23 %
Lymphs Abs: 1.2 10*3/uL (ref 0.7–4.0)
MCH: 28.1 pg (ref 26.0–34.0)
MCHC: 32.4 g/dL (ref 30.0–36.0)
MCV: 86.6 fL (ref 78.0–100.0)
MONO ABS: 0.5 10*3/uL (ref 0.1–1.0)
Monocytes Relative: 9 %
Neutro Abs: 3.5 10*3/uL (ref 1.7–7.7)
Neutrophils Relative %: 66 %
PLATELETS: 210 10*3/uL (ref 150–400)
RBC: 4.99 MIL/uL (ref 3.87–5.11)
RDW: 12.6 % (ref 11.5–15.5)
WBC: 5.3 10*3/uL (ref 4.0–10.5)

## 2016-07-25 LAB — COMPREHENSIVE METABOLIC PANEL
ALK PHOS: 52 U/L (ref 38–126)
ALT: 8 U/L — AB (ref 14–54)
AST: 16 U/L (ref 15–41)
Albumin: 4.3 g/dL (ref 3.5–5.0)
Anion gap: 8 (ref 5–15)
BUN: 12 mg/dL (ref 6–20)
CALCIUM: 9.4 mg/dL (ref 8.9–10.3)
CHLORIDE: 104 mmol/L (ref 101–111)
CO2: 26 mmol/L (ref 22–32)
CREATININE: 0.91 mg/dL (ref 0.44–1.00)
GFR calc non Af Amer: >60 mL/min (ref >60)
GLUCOSE: 74 mg/dL (ref 65–99)
Potassium: 4 mmol/L (ref 3.5–5.1)
SODIUM: 138 mmol/L (ref 135–145)
Total Bilirubin: 0.8 mg/dL (ref 0.3–1.2)
Total Protein: 7.2 g/dL (ref 6.5–8.1)

## 2016-07-25 LAB — SURGICAL PCR SCREEN
MRSA, PCR: NEGATIVE
STAPHYLOCOCCUS AUREUS: POSITIVE — AB

## 2016-07-25 LAB — HCG, SERUM, QUALITATIVE: PREG SERUM: NEGATIVE

## 2016-07-25 LAB — PROTIME-INR
INR: 1.02
Prothrombin Time: 13.5 seconds (ref 11.4–15.2)

## 2016-07-25 LAB — APTT: APTT: 30 s (ref 24–36)

## 2016-07-25 NOTE — Progress Notes (Addendum)
Pt. Goes to [facility] [place].  [facility] [address].

## 2016-07-31 MED ORDER — CEFAZOLIN SODIUM-DEXTROSE 2-4 GM/100ML-% IV SOLN
2.0000 g | INTRAVENOUS | Status: AC
  Administered 2016-08-01: 2 g via INTRAVENOUS
  Filled 2016-07-31: qty 100

## 2016-07-31 NOTE — Anesthesia Preprocedure Evaluation (Addendum)
Anesthesia Evaluation  Patient identified by MRN, date of birth, ID band Patient awake    Reviewed: Allergy & Precautions, NPO status , Patient's Chart, lab work & pertinent test results  History of Anesthesia Complications (+) PONV and history of anesthetic complications  Airway Mallampati: II  TM Distance: >3 FB Neck ROM: Full    Dental  (+) Dental Advisory Given, Teeth Intact   Pulmonary neg pulmonary ROS,    Pulmonary exam normal breath sounds clear to auscultation       Cardiovascular Exercise Tolerance: Good negative cardio ROS   Rhythm:Regular Rate:Normal     Neuro/Psych  Headaches,    GI/Hepatic Neg liver ROS, GERD  ,Crohn's disease   Endo/Other  negative endocrine ROS  Renal/GU negative Renal ROS     Musculoskeletal negative musculoskeletal ROS (+)   Abdominal (+) - obese,   Peds  Hematology  (+) Blood dyscrasia, anemia ,   Anesthesia Other Findings   Reproductive/Obstetrics                            Anesthesia Physical Anesthesia Plan  ASA: II  Anesthesia Plan: General   Post-op Pain Management:    Induction: Intravenous  Airway Management Planned: Oral ETT  Additional Equipment:   Intra-op Plan:   Post-operative Plan: Extubation in OR  Informed Consent: I have reviewed the patients History and Physical, chart, labs and discussed the procedure including the risks, benefits and alternatives for the proposed anesthesia with the patient or authorized representative who has indicated his/her understanding and acceptance.   Dental advisory given  Plan Discussed with:   Anesthesia Plan Comments:        Anesthesia Quick Evaluation

## 2016-08-01 ENCOUNTER — Encounter (HOSPITAL_COMMUNITY): Payer: Self-pay | Admitting: *Deleted

## 2016-08-01 ENCOUNTER — Ambulatory Visit (HOSPITAL_COMMUNITY): Payer: 59

## 2016-08-01 ENCOUNTER — Ambulatory Visit (HOSPITAL_COMMUNITY): Payer: 59 | Admitting: Anesthesiology

## 2016-08-01 ENCOUNTER — Ambulatory Visit (HOSPITAL_COMMUNITY)
Admission: RE | Admit: 2016-08-01 | Discharge: 2016-08-01 | Disposition: A | Discharge status: Home / Self Care | Payer: 59 | Source: Ambulatory Visit | Attending: Orthopedic Surgery | Admitting: Orthopedic Surgery

## 2016-08-01 ENCOUNTER — Encounter (HOSPITAL_COMMUNITY)
Admission: RE | Disposition: A | Discharge status: Home / Self Care | Payer: Self-pay | Source: Ambulatory Visit | Attending: Orthopedic Surgery

## 2016-08-01 DIAGNOSIS — M533 Sacrococcygeal disorders, not elsewhere classified: Secondary | ICD-10-CM | POA: Insufficient documentation

## 2016-08-01 DIAGNOSIS — Z419 Encounter for procedure for purposes other than remedying health state, unspecified: Secondary | ICD-10-CM

## 2016-08-01 HISTORY — PX: SACROILIAC JOINT FUSION: SHX6088

## 2016-08-01 SURGERY — SACROILIAC JOINT FUSION
Anesthesia: General | Laterality: Right

## 2016-08-01 MED ORDER — PROPOFOL 10 MG/ML IV BOLUS
INTRAVENOUS | Status: DC | PRN
  Administered 2016-08-01: 120 mg via INTRAVENOUS

## 2016-08-01 MED ORDER — MIDAZOLAM HCL 2 MG/2ML IJ SOLN
INTRAMUSCULAR | Status: AC
  Filled 2016-08-01: qty 2

## 2016-08-01 MED ORDER — PROMETHAZINE HCL 25 MG/ML IJ SOLN: 6.2500 mg | INTRAMUSCULAR | Status: DC | PRN

## 2016-08-01 MED ORDER — POVIDONE-IODINE 7.5 % EX SOLN: Freq: Once | CUTANEOUS | Status: DC

## 2016-08-01 MED ORDER — ONDANSETRON HCL 4 MG/2ML IJ SOLN
INTRAMUSCULAR | Status: DC | PRN
  Administered 2016-08-01: 4 mg via INTRAVENOUS

## 2016-08-01 MED ORDER — SODIUM CHLORIDE 0.9 % IJ SOLN
INTRAMUSCULAR | Status: AC
  Filled 2016-08-01: qty 10

## 2016-08-01 MED ORDER — PHENYLEPHRINE 40 MCG/ML (10ML) SYRINGE FOR IV PUSH (FOR BLOOD PRESSURE SUPPORT)
PREFILLED_SYRINGE | INTRAVENOUS | Status: DC | PRN
  Administered 2016-08-01: 40 ug via INTRAVENOUS
  Administered 2016-08-01: 80 ug via INTRAVENOUS

## 2016-08-01 MED ORDER — LIDOCAINE 2% (20 MG/ML) 5 ML SYRINGE
INTRAMUSCULAR | Status: AC
  Filled 2016-08-01: qty 5

## 2016-08-01 MED ORDER — SUFENTANIL CITRATE 50 MCG/ML IV SOLN
INTRAVENOUS | Status: DC | PRN
  Administered 2016-08-01: 15 ug via INTRAVENOUS
  Administered 2016-08-01: 5 ug via INTRAVENOUS

## 2016-08-01 MED ORDER — PHENYLEPHRINE 40 MCG/ML (10ML) SYRINGE FOR IV PUSH (FOR BLOOD PRESSURE SUPPORT)
PREFILLED_SYRINGE | INTRAVENOUS | Status: AC
  Filled 2016-08-01: qty 20

## 2016-08-01 MED ORDER — MIDAZOLAM HCL 5 MG/5ML IJ SOLN
INTRAMUSCULAR | Status: DC | PRN
  Administered 2016-08-01: 2 mg via INTRAVENOUS

## 2016-08-01 MED ORDER — ROCURONIUM BROMIDE 10 MG/ML (PF) SYRINGE
PREFILLED_SYRINGE | INTRAVENOUS | Status: AC
  Filled 2016-08-01: qty 10

## 2016-08-01 MED ORDER — SUGAMMADEX SODIUM 200 MG/2ML IV SOLN
INTRAVENOUS | Status: AC
  Filled 2016-08-01: qty 2

## 2016-08-01 MED ORDER — SUFENTANIL CITRATE 50 MCG/ML IV SOLN
INTRAVENOUS | Status: AC
  Filled 2016-08-01: qty 1

## 2016-08-01 MED ORDER — FENTANYL CITRATE (PF) 100 MCG/2ML IJ SOLN
INTRAMUSCULAR | Status: AC
  Filled 2016-08-01: qty 2

## 2016-08-01 MED ORDER — SUGAMMADEX SODIUM 200 MG/2ML IV SOLN
INTRAVENOUS | Status: DC | PRN
  Administered 2016-08-01: 100 mg via INTRAVENOUS

## 2016-08-01 MED ORDER — ONDANSETRON HCL 4 MG/2ML IJ SOLN
INTRAMUSCULAR | Status: AC
  Filled 2016-08-01: qty 2

## 2016-08-01 MED ORDER — LIDOCAINE 2% (20 MG/ML) 5 ML SYRINGE
INTRAMUSCULAR | Status: DC | PRN
  Administered 2016-08-01: 20 mg via INTRAVENOUS

## 2016-08-01 MED ORDER — BUPIVACAINE-EPINEPHRINE (PF) 0.25% -1:200000 IJ SOLN
INTRAMUSCULAR | Status: AC
  Filled 2016-08-01: qty 30

## 2016-08-01 MED ORDER — LACTATED RINGERS IV SOLN
INTRAVENOUS | Status: DC
  Administered 2016-08-01: 09:00:00 via INTRAVENOUS

## 2016-08-01 MED ORDER — FENTANYL CITRATE (PF) 100 MCG/2ML IJ SOLN
25.0000 ug | INTRAMUSCULAR | Status: DC | PRN
  Administered 2016-08-01: 25 ug via INTRAVENOUS
  Administered 2016-08-01: 50 ug via INTRAVENOUS
  Administered 2016-08-01: 25 ug via INTRAVENOUS

## 2016-08-01 MED ORDER — ROCURONIUM BROMIDE 10 MG/ML (PF) SYRINGE
PREFILLED_SYRINGE | INTRAVENOUS | Status: DC | PRN
  Administered 2016-08-01: 30 mg via INTRAVENOUS

## 2016-08-01 MED ORDER — 0.9 % SODIUM CHLORIDE (POUR BTL) OPTIME
TOPICAL | Status: DC | PRN
  Administered 2016-08-01: 2000 mL

## 2016-08-01 MED ORDER — BUPIVACAINE-EPINEPHRINE (PF) 0.25% -1:200000 IJ SOLN
INTRAMUSCULAR | Status: DC | PRN
  Administered 2016-08-01: 5 mL

## 2016-08-01 SURGICAL SUPPLY — 61 items
APL SKNCLS STERI-STRIP NONHPOA (GAUZE/BANDAGES/DRESSINGS) ×1
BENZOIN TINCTURE PRP APPL 2/3 (GAUZE/BANDAGES/DRESSINGS) ×2 IMPLANT
BLADE SURG 10 STRL SS (BLADE) ×2 IMPLANT
BLADE SURG ROTATE 9660 (MISCELLANEOUS) ×2 IMPLANT
CANISTER SUCTION 2500CC (MISCELLANEOUS) ×2 IMPLANT
COVER SURGICAL LIGHT HANDLE (MISCELLANEOUS) ×4 IMPLANT
DRAPE C-ARM 42X72 X-RAY (DRAPES) ×2 IMPLANT
DRAPE C-ARMOR (DRAPES) ×2 IMPLANT
DRAPE INCISE IOBAN 66X45 STRL (DRAPES) ×2 IMPLANT
DRAPE POUCH INSTRU U-SHP 10X18 (DRAPES) ×2 IMPLANT
DRAPE SURG 17X23 STRL (DRAPES) ×6 IMPLANT
DURAPREP 26ML APPLICATOR (WOUND CARE) ×2 IMPLANT
ELECT CAUTERY BLADE 6.4 (BLADE) ×2 IMPLANT
ELECT REM PT RETURN 9FT ADLT (ELECTROSURGICAL) ×2
ELECTRODE REM PT RTRN 9FT ADLT (ELECTROSURGICAL) ×1 IMPLANT
GAUZE SPONGE 4X4 12PLY STRL (GAUZE/BANDAGES/DRESSINGS) ×2 IMPLANT
GAUZE SPONGE 4X4 16PLY XRAY LF (GAUZE/BANDAGES/DRESSINGS) ×3 IMPLANT
GLOVE BIO SURGEON STRL SZ7 (GLOVE) ×2 IMPLANT
GLOVE BIO SURGEON STRL SZ8 (GLOVE) ×2 IMPLANT
GLOVE BIOGEL PI IND STRL 7.0 (GLOVE) ×1 IMPLANT
GLOVE BIOGEL PI IND STRL 8 (GLOVE) ×1 IMPLANT
GLOVE BIOGEL PI INDICATOR 7.0 (GLOVE) ×1
GLOVE BIOGEL PI INDICATOR 8 (GLOVE) ×1
GOWN STRL REUS W/ TWL LRG LVL3 (GOWN DISPOSABLE) ×2 IMPLANT
GOWN STRL REUS W/ TWL XL LVL3 (GOWN DISPOSABLE) ×1 IMPLANT
GOWN STRL REUS W/TWL LRG LVL3 (GOWN DISPOSABLE) ×4
GOWN STRL REUS W/TWL XL LVL3 (GOWN DISPOSABLE) ×2
IMPL IFUSE 7.0MM X 65MM (Rod) IMPLANT
IMPL IFUSE 7.0MMX45MM (Rod) IMPLANT
IMPL IFUSE 7.0X50MM (Rod) IMPLANT
IMPLANT IFUSE 7.0MM X 65MM (Rod) ×2 IMPLANT
IMPLANT IFUSE 7.0MMX45MM (Rod) ×2 IMPLANT
IMPLANT IFUSE 7.0X50MM (Rod) ×2 IMPLANT
KIT BASIN OR (CUSTOM PROCEDURE TRAY) ×2 IMPLANT
KIT ROOM TURNOVER OR (KITS) ×2 IMPLANT
MANIFOLD NEPTUNE II (INSTRUMENTS) ×2 IMPLANT
NDL HYPO 25GX1X1/2 BEV (NEEDLE) ×1 IMPLANT
NEEDLE 22X1 1/2 (OR ONLY) (NEEDLE) ×2 IMPLANT
NEEDLE HYPO 25GX1X1/2 BEV (NEEDLE) ×2 IMPLANT
NS IRRIG 1000ML POUR BTL (IV SOLUTION) ×2 IMPLANT
PACK UNIVERSAL I (CUSTOM PROCEDURE TRAY) ×2 IMPLANT
PAD ARMBOARD 7.5X6 YLW CONV (MISCELLANEOUS) ×4 IMPLANT
PENCIL BUTTON HOLSTER BLD 10FT (ELECTRODE) ×2 IMPLANT
SPONGE GAUZE 4X4 12PLY STER LF (GAUZE/BANDAGES/DRESSINGS) ×1 IMPLANT
SPONGE LAP 18X18 X RAY DECT (DISPOSABLE) ×2 IMPLANT
STAPLER VISISTAT 35W (STAPLE) ×2 IMPLANT
STRIP CLOSURE SKIN 1/2X4 (GAUZE/BANDAGES/DRESSINGS) ×2 IMPLANT
SUT MNCRL AB 4-0 PS2 18 (SUTURE) ×3 IMPLANT
SUT VIC AB 0 CT1 18XCR BRD 8 (SUTURE) IMPLANT
SUT VIC AB 0 CT1 8-18 (SUTURE)
SUT VIC AB 1 CT1 18XCR BRD 8 (SUTURE) ×1 IMPLANT
SUT VIC AB 1 CT1 8-18 (SUTURE) ×4
SUT VIC AB 2-0 CT2 18 VCP726D (SUTURE) ×3 IMPLANT
SYR BULB IRRIGATION 50ML (SYRINGE) ×4 IMPLANT
SYR CONTROL 10ML LL (SYRINGE) ×2 IMPLANT
TAPE CLOTH SURG 4X10 WHT LF (GAUZE/BANDAGES/DRESSINGS) ×1 IMPLANT
TOWEL OR 17X24 6PK STRL BLUE (TOWEL DISPOSABLE) ×2 IMPLANT
TOWEL OR 17X26 10 PK STRL BLUE (TOWEL DISPOSABLE) ×4 IMPLANT
TUBE CONNECTING 12X1/4 (SUCTIONS) ×2 IMPLANT
WATER STERILE IRR 1000ML POUR (IV SOLUTION) ×2 IMPLANT
YANKAUER SUCT BULB TIP NO VENT (SUCTIONS) ×2 IMPLANT

## 2016-08-01 NOTE — Anesthesia Procedure Notes (Signed)
Procedure Name: Intubation Date/Time: 08/01/2016 10:47 AM Performed by: Charm BargesBUTLER, Blanchard Willhite R Pre-anesthesia Checklist: Patient identified, Emergency Drugs available, Suction available and Patient being monitored Patient Re-evaluated:Patient Re-evaluated prior to inductionOxygen Delivery Method: Circle System Utilized Preoxygenation: Pre-oxygenation with 100% oxygen Intubation Type: IV induction Ventilation: Mask ventilation without difficulty Laryngoscope Size: Mac and 3 Grade View: Grade II Tube type: Oral Tube size: 7.0 mm Number of attempts: 1 Airway Equipment and Method: Stylet and Oral airway Placement Confirmation: ETT inserted through vocal cords under direct vision,  positive ETCO2 and breath sounds checked- equal and bilateral Secured at: 21 cm Tube secured with: Tape Dental Injury: Teeth and Oropharynx as per pre-operative assessment

## 2016-08-01 NOTE — H&P (Signed)
     PREOPERATIVE H&P  Chief Complaint: R low back pain  HPI: Amber Fox is a 40 y.o. female who presents with ongoing pain in the low back on the R side over the R SI region  Patient did obtain excellent benefit from a R si injection, and with other forms of treatment, but her pain persisted. SI joint sclerosis noted on MRI  Patient has failed multiple forms of conservative care and continues to have pain (see office notes for additional details regarding the patient's full course of treatment)  Past Medical History:  Diagnosis Date  . Allergy   . Anemia   . Crohn's disease (HCC)   . GERD (gastroesophageal reflux disease)   . Headache(784.0)    MIGRAINE AND TENSION  . Joint pain    DISINTRIGATING SI JOINT   Past Surgical History:  Procedure Laterality Date  . CESAREAN SECTION  2010  . COLONOSCOPY WITH PROPOFOL N/A 05/24/2014   Procedure: COLONOSCOPY WITH PROPOFOL;  Surgeon: Charolett Bumpers, MD;  Location: [facility];  Service: Endoscopy;  Laterality: N/A;   Social History   Social History  . Marital status: Single    Spouse name: N/A  . Number of children: 1  . Years of education: N/A   Occupational History  . 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 Smoking status: Never Smoker  . Smokeless tobacco: Never Used  . Alcohol use No  . Drug use: No  . Sexual activity: Yes    Birth control/ protection: None   Other Topics Concern  . Not on file   Social History Narrative   Lives with Rubye Beach, her boyfriend, and her daughter.   Family History  Problem Relation Age of Onset  . Colitis Mother   . Stroke Maternal Grandfather   . Alzheimer's disease Paternal Grandmother   . Emphysema Paternal Grandfather    Allergies  Allergen Reactions  . Iron Other (See Comments)    HEADACHE  . Sulfa Antibiotics Itching   Prior to Admission medications   Medication Sig Start Date End Date Taking? Authorizing Provider    acetaminophen (TYLENOL) 500 MG tablet Take 1,000 mg by mouth every 6 (six) hours as needed for mild pain or moderate pain.   Yes Historical Provider, MD  ibuprofen (ADVIL,MOTRIN) 200 MG tablet Take 600-800 mg by mouth every 6 (six) hours as needed for mild pain or moderate pain.   Yes Historical Provider, MD     All other systems have been reviewed and were otherwise negative with the exception of those mentioned in the HPI and as above.  Physical Exam: There were no vitals filed for this visit.  General: Alert, no acute distress Cardiovascular: No pedal edema Respiratory: No cyanosis, no use of accessory musculature Skin: No lesions in the area of chief complaint Neurologic: Sensation intact distally Psychiatric: Patient is competent for consent with normal mood and affect Lymphatic: No axillary or cervical lymphadenopathy  MUSCULOSKELETAL: + pain over R SI region  Assessment/Plan: Right-sided sacroiliac joint dysfunction Plan for Procedure(s): RIGHT SIDED SACROILIAC JOINT FUSION   Emilee Hero, MD 08/01/2016 7:03 AM

## 2016-08-01 NOTE — Transfer of Care (Signed)
Immediate Anesthesia Transfer of Care Note  Patient: Amber Fox  Procedure(s) Performed: Procedure(s) with comments: RIGHT SIDED SACROILIAC JOINT FUSION (Right) - RIGHT SIDED SACROILIAC JOINT FUSION  Patient Location: PACU  Anesthesia Type:General  Level of Consciousness: awake, oriented and patient cooperative  Airway & Oxygen Therapy: Patient Spontanous Breathing and Patient connected to nasal cannula oxygen  Post-op Assessment: Report given to RN, Post -op Vital signs reviewed and stable and Patient moving all extremities  Post vital signs: Reviewed and stable  Last Vitals:  Vitals:   08/01/16 0846 08/01/16 1216  BP: (!) 115/53   Pulse: 68 83  Resp: 20 (!) 9  Temp: 36.9 C 36.6 C    Last Pain:  Vitals:   08/01/16 1216  TempSrc:   PainSc: 5       Patients Stated Pain Goal: 7 (08/01/16 0829)  Complications: No apparent anesthesia complications

## 2016-08-01 NOTE — Op Note (Signed)
[name], [name] NO.:  [id number]  MEDICAL RECORD NO.:  [id number]  LOCATION:  [facility]                         FACILITY:  [facility]  PHYSICIAN:  Estill Bamberg, MD      DATE OF BIRTH:  December 03, 1976  DATE OF PROCEDURE:  08/01/2016 DATE OF DISCHARGE:  08/01/2016                              OPERATIVE REPORT   PREOPERATIVE DIAGNOSIS:  Right sacroiliac joint dysfunction.  POSTOPERATIVE DIAGNOSIS:  Right sacroiliac joint dysfunction.  PROCEDURE:  Minimally invasive right sacroiliac joint fusion.  SURGEON:  Estill Bamberg, MD.  ASSISTANTJason Coop, PA-C.  ANESTHESIA:  General endotracheal anesthesia.  COMPLICATIONS:  None.  DISPOSITION:  Stable.  ESTIMATED BLOOD LOSS:  Minimal.  INDICATIONS FOR SURGERY:  Briefly, Amber Fox is a very pleasant, 40 year old female, who did present to me with ongoing pain at the right side of her low back, greater than the left side.  It was felt that her pain was secondary to sacroiliac joint dysfunction.  Given this, we did proceed with an injection.  She did get excellent benefit temporarily with a right sacroiliac joint injection.  An MRI of her pelvis did reveal sclerosis across the right sacroiliac joint.  We therefore did discuss proceeding with the procedure noted above.  The patient was fully aware of the risks and limitations of surgery and did elect to proceed.  OPERATIVE DETAILS:  On August 01, 2016, the patient was brought to surgery and general endotracheal anesthesia was administered.  The patient was placed prone on a well-padded flat Jackson bed.  Gels were placed under the patient's chest and hips.  Antibiotics were given.  A time-out was performed.  The right buttock was prepped and draped, and a small 3 cm incision was made overlying the right sacroiliac joint. Then, using lateral, inlet, and outlet fluoroscopy, I did advance 3 guidewires across the sacroiliac joint.  The first was just above the  S1 foramen, the second was in line with the foramen, and the third was between the first and second foramen.  I then drilled and broached over the guidewires.  I then advanced 7 mm implants of the appropriate length across the sacroiliac joint.  I was very pleased with the press-fit of each of the implants.  The guidewires were removed.  I was very pleased with the fluoroscopic appearance of the implants.  The wound was then copiously irrigated.  The wound was then closed in layers using 0 Vicryl, followed by 2-0 Vicryl, followed by 4-0 Monocryl.  Benzoin and Steri-Strips were applied followed by sterile dressing.  All instrument counts were correct at the termination of the procedure.  Of note, Jason Coop was my assistant throughout surgery and did aid in retraction, suctioning, and closure from start to finish.     Estill Bamberg, MD     MD/MEDQ  D:  08/01/2016  T:  08/01/2016  Job:  [id number]

## 2016-08-01 NOTE — Anesthesia Postprocedure Evaluation (Signed)
Anesthesia Post Note  Patient: Amber Fox  Procedure(s) Performed: Procedure(s) (LRB): RIGHT SIDED SACROILIAC JOINT FUSION (Right)  Patient location during evaluation: PACU Anesthesia Type: General Level of consciousness: awake and alert Pain management: pain level controlled Vital Signs Assessment: post-procedure vital signs reviewed and stable Respiratory status: spontaneous breathing, nonlabored ventilation and respiratory function stable Cardiovascular status: blood pressure returned to baseline and stable Postop Assessment: no signs of nausea or vomiting Anesthetic complications: no    Last Vitals:  Vitals:   08/01/16 1230 08/01/16 1240  BP: 107/64 115/60  Pulse: 70 64  Resp: 17 10  Temp:      Last Pain:  Vitals:   08/01/16 1244  TempSrc:   PainSc: 6                  Linton Rump

## 2016-08-05 ENCOUNTER — Encounter (HOSPITAL_COMMUNITY): Payer: Self-pay | Admitting: Orthopedic Surgery

## 2017-09-24 IMAGING — CR DG CHEST 2V
2 series · 2 of 2 positions shown · non-contrast
Comparison: 05/20/2001 report.

CLINICAL DATA: SI joint fusion.  Preoperative chest x-ray.

EXAM:
CHEST  2 VIEW

[chest pa]
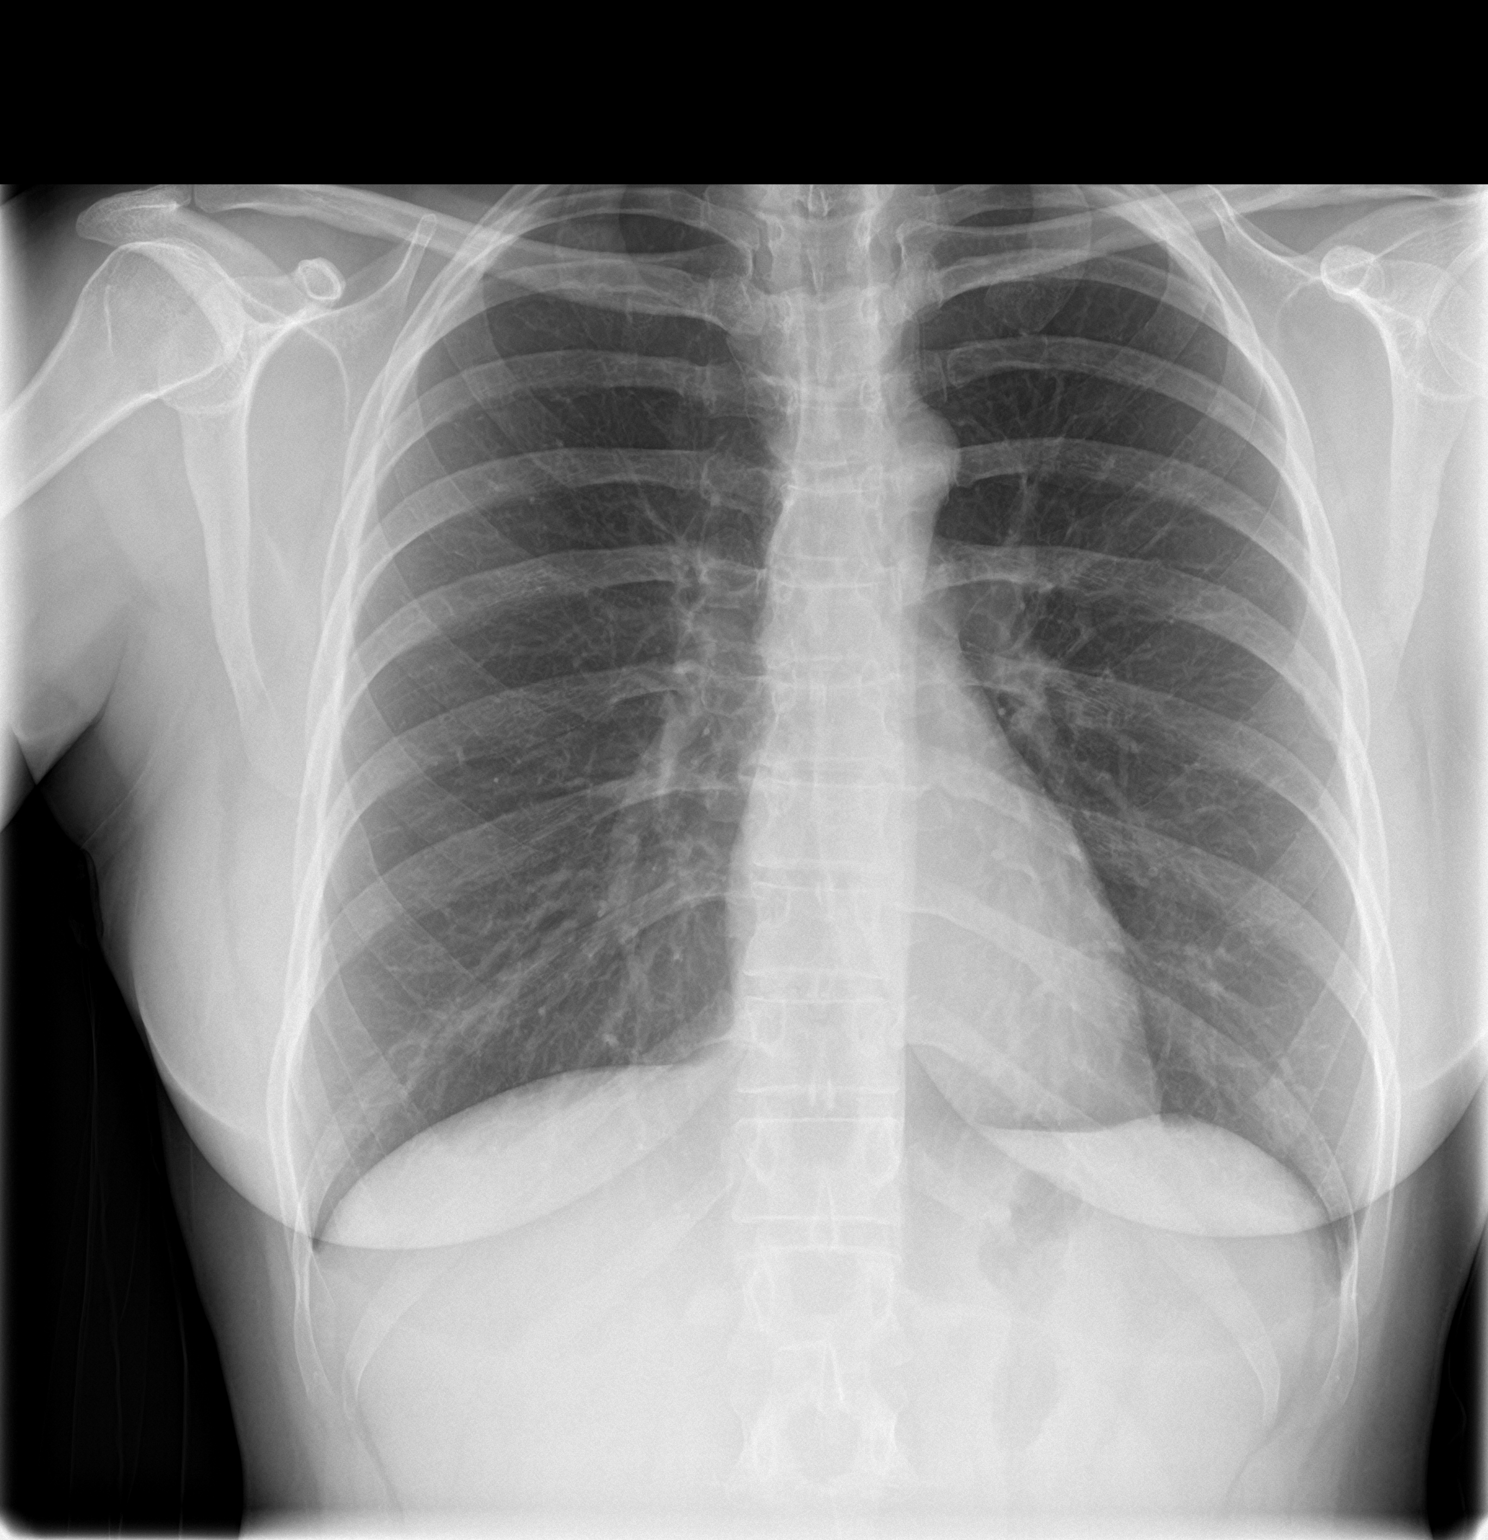

[chest lat]
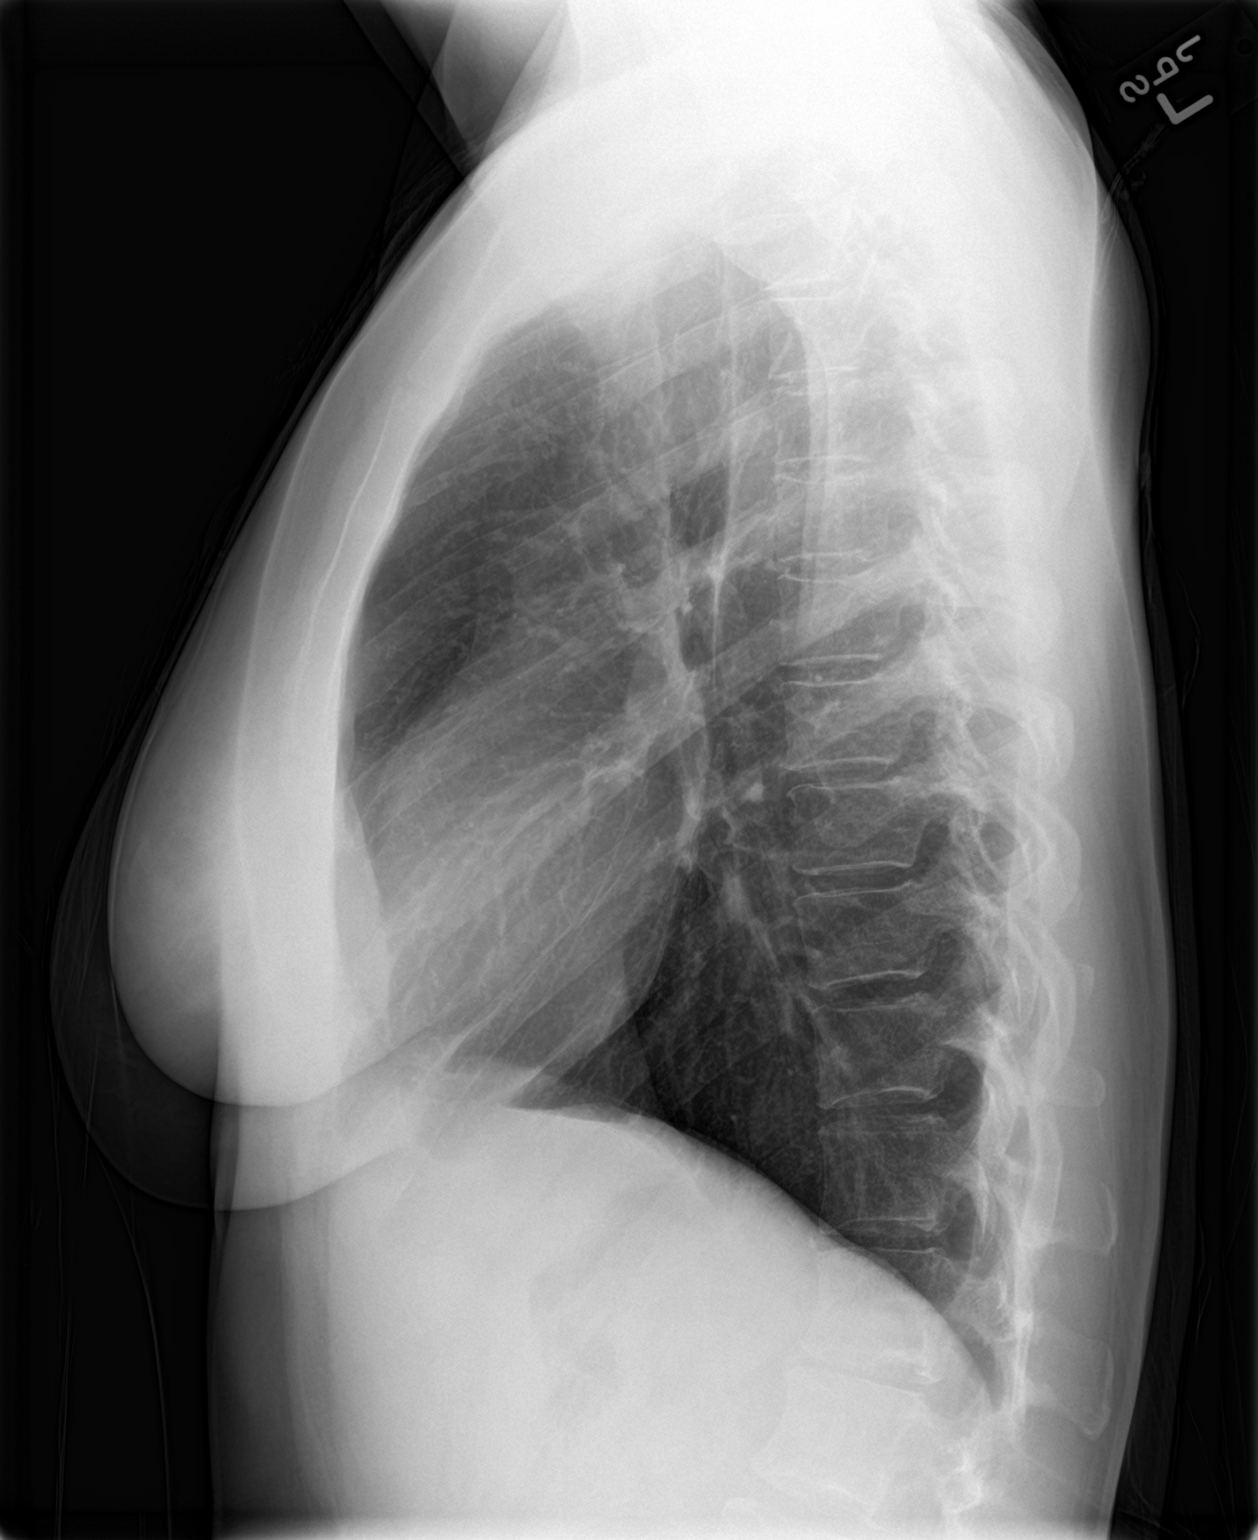

[2 of 2 positions shown; findings below may reference images not displayed]

FINDINGS: The heart size and mediastinal contours are within normal limits.
Both lungs are clear. The visualized skeletal structures are
unremarkable.
IMPRESSION: No active cardiopulmonary disease.

## 2018-04-30 ENCOUNTER — Encounter (HOSPITAL_BASED_OUTPATIENT_CLINIC_OR_DEPARTMENT_OTHER): Payer: Self-pay | Admitting: Adult Health

## 2018-04-30 ENCOUNTER — Other Ambulatory Visit: Payer: Self-pay

## 2018-04-30 ENCOUNTER — Emergency Department (HOSPITAL_BASED_OUTPATIENT_CLINIC_OR_DEPARTMENT_OTHER)
Admission: EM | Admit: 2018-04-30 | Discharge: 2018-05-01 | Disposition: A | Discharge status: Home / Self Care | Payer: 59 | Attending: Physician Assistant | Admitting: Physician Assistant

## 2018-04-30 DIAGNOSIS — Y929 Unspecified place or not applicable: Secondary | ICD-10-CM | POA: Insufficient documentation

## 2018-04-30 DIAGNOSIS — W540XXA Bitten by dog, initial encounter: Secondary | ICD-10-CM | POA: Diagnosis not present

## 2018-04-30 DIAGNOSIS — Y999 Unspecified external cause status: Secondary | ICD-10-CM | POA: Diagnosis not present

## 2018-04-30 DIAGNOSIS — Z203 Contact with and (suspected) exposure to rabies: Secondary | ICD-10-CM | POA: Insufficient documentation

## 2018-04-30 DIAGNOSIS — Z23 Encounter for immunization: Secondary | ICD-10-CM | POA: Diagnosis not present

## 2018-04-30 DIAGNOSIS — Y939 Activity, unspecified: Secondary | ICD-10-CM | POA: Diagnosis not present

## 2018-04-30 DIAGNOSIS — Z3202 Encounter for pregnancy test, result negative: Secondary | ICD-10-CM | POA: Diagnosis not present

## 2018-04-30 DIAGNOSIS — S71131A Puncture wound without foreign body, right thigh, initial encounter: Secondary | ICD-10-CM | POA: Diagnosis present

## 2018-04-30 DIAGNOSIS — Z2914 Encounter for prophylactic rabies immune globin: Secondary | ICD-10-CM | POA: Insufficient documentation

## 2018-04-30 MED ORDER — AMOXICILLIN-POT CLAVULANATE 875-125 MG PO TABS: 1.0000 | ORAL_TABLET | Freq: Two times a day (BID) | ORAL | 0 refills | Status: DC

## 2018-04-30 MED ORDER — RABIES IMMUNE GLOBULIN 150 UNIT/ML IM INJ
20.0000 [IU]/kg | INJECTION | Freq: Once | INTRAMUSCULAR | Status: AC
  Administered 2018-04-30: 1200 [IU] via INTRAMUSCULAR
  Filled 2018-04-30: qty 8

## 2018-04-30 MED ORDER — TETANUS-DIPHTH-ACELL PERTUSSIS 5-2.5-18.5 LF-MCG/0.5 IM SUSP
0.5000 mL | Freq: Once | INTRAMUSCULAR | Status: AC
  Administered 2018-04-30: 0.5 mL via INTRAMUSCULAR
  Filled 2018-04-30: qty 0.5

## 2018-04-30 MED ORDER — RABIES VACCINE, PCEC IM SUSR
1.0000 mL | Freq: Once | INTRAMUSCULAR | Status: AC
  Administered 2018-04-30: 1 mL via INTRAMUSCULAR
  Filled 2018-04-30: qty 1

## 2018-04-30 NOTE — Discharge Instructions (Signed)
Take antibiotics as prescribed.  Take the entire course, even if your symptoms improve. Keep the puncture wounds clean and covered.  Wash daily soap and water. Follow up at urgent care for further rabies vaccines. Return to the emergency room if you develop high fevers, pus draining from the cuts, or any new or concerning symptoms.

## 2018-04-30 NOTE — ED Notes (Signed)
Dog bite to right upper thigh by her neighbors dog.  No bleeding noted. 2 puncture wounds noted with bruising around each wound.

## 2018-04-30 NOTE — ED Provider Notes (Signed)
[facility] EMERGENCY DEPARTMENT Provider Note   CSN: [id number] Arrival date & time: 04/30/18  2056     History   Chief Complaint Chief Complaint  Patient presents with  . Animal Bite    Right Thigh    HPI Amber Fox is a 43 y.o. female presenting for evaluation of dog bite.  Patient states that just prior to arrival, she was bitten by her neighbor's dog on the right thigh.  She reports acute onset pain and minimal bleeding.  She has ambulated since.  She denies injury elsewhere.  She is unsure if the dog is up-to-date on its rabies shots, but cannot ask the neighbor.  She has not taken anything for pain, reports pain has resolved since she has been sitting down.  She denies numbness or tingling.  She is unsure when her last tetanus shot was, believes it was more than 5 years ago.  She is not on blood thinners.  She takes no medications daily, denies any medical problems.  HPI  Past Medical History:  Diagnosis Date  . Allergy   . Anemia   . Crohn's disease (HCC)   . GERD (gastroesophageal reflux disease)   . Headache(784.0)    MIGRAINE AND TENSION  . Joint pain    DISINTRIGATING SI JOINT    Patient Active Problem List   Diagnosis Date Noted  . Crohn's disease (HCC) 07/05/2013  . Headache(784.0) 07/05/2013    Past Surgical History:  Procedure Laterality Date  . CESAREAN SECTION  2010  . COLONOSCOPY WITH PROPOFOL N/A 05/24/2014   Procedure: COLONOSCOPY WITH PROPOFOL;  Surgeon: Charolett Bumpers, MD;  Location: [facility];  Service: Endoscopy;  Laterality: N/A;  . SACROILIAC JOINT FUSION Right 08/01/2016   Procedure: RIGHT SIDED SACROILIAC JOINT FUSION;  Surgeon: Estill Bamberg, MD;  Location: [facility];  Service: Orthopedics;  Laterality: Right;  RIGHT SIDED SACROILIAC JOINT FUSION     OB History   None      Home Medications    Prior to Admission medications   Medication Sig Start Date End Date Taking? Authorizing Provider  acetaminophen  (TYLENOL) 500 MG tablet Take 1,000 mg by mouth every 6 (six) hours as needed for mild pain or moderate pain.    [provider]  amoxicillin-clavulanate (AUGMENTIN) 875-125 MG tablet Take 1 tablet by mouth every 12 (twelve) hours. 04/30/18   Makailey Hodgkin, PA-C    Family History Family History  Problem Relation Age of Onset  . Colitis Mother   . Stroke Maternal Grandfather   . Alzheimer's disease Paternal Grandmother   . Emphysema Paternal Grandfather     Social History Social History   Tobacco Use  . Smoking status: Never Smoker  . Smokeless tobacco: Never Used  Substance Use Topics  . Alcohol use: No  . Drug use: No     Allergies   Cortisone; Iron; and Sulfa antibiotics   Review of Systems Review of Systems  Musculoskeletal: Positive for myalgias (Resolved).  Skin: Positive for wound.  Neurological: Negative for numbness.  Hematological: Does not bruise/bleed easily.     Physical Exam Updated Vital Signs Ht 5\' 4"  (1.626 m)   Wt 58.5 kg (129 lb)   BMI 22.14 kg/m   Physical Exam  Constitutional: She is oriented to person, place, and time. She appears well-developed and well-nourished. No distress.  HENT:  Head: Normocephalic and atraumatic.  Eyes: EOM are normal.  Neck: Normal range of motion.  Cardiovascular: Normal rate, regular rhythm and  intact distal pulses.  Pulmonary/Chest: Effort normal and breath sounds normal. No respiratory distress. She has no wheezes.  Abdominal: She exhibits no distension.  Musculoskeletal: Normal range of motion. She exhibits tenderness. She exhibits no edema.  Mild puncture wounds to anterior lateral right thigh with surrounding contusion.  Soft compartments.  No surrounding tenderness.  No active drainage or bleeding.  Pedal pulses intact bilaterally.  Strength of lower extremity is intact bilaterally.  Patient is ambulatory.  No injury noted elsewhere.  Neurological: She is alert and oriented to person, place, and  time. No sensory deficit.  Skin: Skin is warm. No rash noted.  Psychiatric: She has a normal mood and affect.  Nursing note and vitals reviewed.    ED Treatments / Results  Labs (all labs ordered are listed, but only abnormal results are displayed) Labs Reviewed  PREGNANCY, URINE    EKG None  Radiology No results found.  Procedures Procedures (including critical care time)  Medications Ordered in ED Medications  rabies immune globulin (HYPERAB/KEDRAB) injection 1,200 Units (1,200 Units Intramuscular Given 04/30/18 2345)  rabies vaccine (RABAVERT) injection 1 mL (1 mL Intramuscular Given 04/30/18 2343)  Tdap (BOOSTRIX) injection 0.5 mL (0.5 mLs Intramuscular Given 04/30/18 2342)     Initial Impression / Assessment and Plan / ED Course  I have reviewed the triage vital signs and the nursing notes.  Pertinent labs & imaging results that were available during my care of the patient were reviewed by me and considered in my medical decision making (see chart for details).     Pt presenting for evaluation of dog bite.  Physical exam shows patient with 2 small puncture wounds without active bleeding and surrounding contusion.  Soft compartments.  She is neurovascularly intact.  Discussed option of rabies shots, as patient is unsure if the dog was vaccinated.  Patient elects to have rabies vaccine done today.  Will update tetanus.  Wound cleaned and covered.  Will place on Augmentin for prophylaxis.  Discussed schedule of rabies vaccines, patient states he understands.  Follow-up as needed.  At this time, patient appears safe for discharge.  Return precautions given.  Patient states she understands and agrees to plan.  Final Clinical Impressions(s) / ED Diagnoses   Final diagnoses:  Dog bite, initial encounter    ED Discharge Orders        Ordered    amoxicillin-clavulanate (AUGMENTIN) 875-125 MG tablet  Every 12 hours     04/30/18 2335       Alveria Apley, PA-C 04/30/18  2353    Abelino Derrick, MD 05/01/18 0007

## 2018-04-30 NOTE — ED Triage Notes (Signed)
PResents with german shepard bite to right leg, two puncture wounds and large hematoma, unknown rabies status. IT was her neighbors dog.

## 2018-05-01 LAB — PREGNANCY, URINE: Preg Test, Ur: NEGATIVE

## 2018-05-03 ENCOUNTER — Encounter (HOSPITAL_COMMUNITY): Payer: Self-pay | Admitting: Emergency Medicine

## 2018-05-03 ENCOUNTER — Ambulatory Visit (HOSPITAL_COMMUNITY)
Admission: EM | Admit: 2018-05-03 | Discharge: 2018-05-03 | Disposition: A | Discharge status: Home / Self Care | Payer: 59 | Attending: Internal Medicine | Admitting: Internal Medicine

## 2018-05-03 ENCOUNTER — Other Ambulatory Visit: Payer: Self-pay

## 2018-05-03 DIAGNOSIS — Z203 Contact with and (suspected) exposure to rabies: Secondary | ICD-10-CM

## 2018-05-03 DIAGNOSIS — Z23 Encounter for immunization: Secondary | ICD-10-CM | POA: Diagnosis not present

## 2018-05-03 MED ORDER — RABIES VACCINE, PCEC IM SUSR
1.0000 mL | Freq: Once | INTRAMUSCULAR | Status: AC
  Administered 2018-05-03: 1 mL via INTRAMUSCULAR

## 2018-05-03 MED ORDER — RABIES VACCINE, PCEC IM SUSR
INTRAMUSCULAR | Status: AC
  Filled 2018-05-03: qty 1

## 2018-05-03 NOTE — ED Triage Notes (Signed)
Second shot in rabies series.

## 2018-05-03 NOTE — Discharge Instructions (Signed)
Return as instructed, sooner if any concerns °

## 2018-05-07 ENCOUNTER — Ambulatory Visit (HOSPITAL_COMMUNITY)
Admission: EM | Admit: 2018-05-07 | Discharge: 2018-05-07 | Disposition: A | Discharge status: Home / Self Care | Payer: 59 | Attending: Family Medicine | Admitting: Family Medicine

## 2018-05-07 DIAGNOSIS — Z203 Contact with and (suspected) exposure to rabies: Secondary | ICD-10-CM

## 2018-05-07 DIAGNOSIS — Z23 Encounter for immunization: Secondary | ICD-10-CM | POA: Diagnosis not present

## 2018-05-07 MED ORDER — RABIES VACCINE, PCEC IM SUSR
1.0000 mL | Freq: Once | INTRAMUSCULAR | Status: AC
  Administered 2018-05-07: 1 mL via INTRAMUSCULAR

## 2018-05-07 MED ORDER — RABIES VACCINE, PCEC IM SUSR
INTRAMUSCULAR | Status: AC
  Filled 2018-05-07: qty 1

## 2018-05-07 NOTE — ED Notes (Signed)
Pt here for day 7 rabies shot.  

## 2018-05-14 ENCOUNTER — Ambulatory Visit (HOSPITAL_COMMUNITY)
Admission: EM | Admit: 2018-05-14 | Discharge: 2018-05-14 | Disposition: A | Discharge status: Home / Self Care | Payer: 59 | Attending: Internal Medicine | Admitting: Internal Medicine

## 2018-05-14 DIAGNOSIS — Z23 Encounter for immunization: Secondary | ICD-10-CM

## 2018-05-14 DIAGNOSIS — Z203 Contact with and (suspected) exposure to rabies: Secondary | ICD-10-CM

## 2018-05-14 MED ORDER — RABIES VACCINE, PCEC IM SUSR
1.0000 mL | Freq: Once | INTRAMUSCULAR | Status: AC
  Administered 2018-05-14: 1 mL via INTRAMUSCULAR

## 2018-05-14 MED ORDER — RABIES VACCINE, PCEC IM SUSR
INTRAMUSCULAR | Status: AC
  Filled 2018-05-14: qty 1

## 2018-05-14 NOTE — ED Triage Notes (Signed)
Pt here for rabies injection. 

## 2022-07-03 ENCOUNTER — Encounter (HOSPITAL_COMMUNITY): Payer: Self-pay

## 2022-07-03 ENCOUNTER — Emergency Department (HOSPITAL_COMMUNITY)
Admission: EM | Admit: 2022-07-03 | Discharge: 2022-07-03 | Disposition: A | Discharge status: Home / Self Care | Payer: 59 | Attending: Emergency Medicine | Admitting: Emergency Medicine

## 2022-07-03 ENCOUNTER — Emergency Department (HOSPITAL_COMMUNITY): Payer: 59

## 2022-07-03 DIAGNOSIS — S52592A Other fractures of lower end of left radius, initial encounter for closed fracture: Secondary | ICD-10-CM | POA: Insufficient documentation

## 2022-07-03 DIAGNOSIS — W010XXA Fall on same level from slipping, tripping and stumbling without subsequent striking against object, initial encounter: Secondary | ICD-10-CM | POA: Insufficient documentation

## 2022-07-03 DIAGNOSIS — S6992XA Unspecified injury of left wrist, hand and finger(s), initial encounter: Secondary | ICD-10-CM | POA: Diagnosis present

## 2022-07-03 DIAGNOSIS — S62101A Fracture of unspecified carpal bone, right wrist, initial encounter for closed fracture: Secondary | ICD-10-CM

## 2022-07-03 MED ORDER — OXYCODONE-ACETAMINOPHEN 5-325 MG PO TABS: 1.0000 | ORAL_TABLET | Freq: Four times a day (QID) | ORAL | 0 refills | Status: DC | PRN

## 2022-07-03 NOTE — ED Triage Notes (Signed)
Pt arrived via POV, slip and fall earlier today, c/o left wrist pain.

## 2022-07-03 NOTE — ED Provider Notes (Signed)
[facility]-EMERGENCY DEPT Provider Note   CSN: [id number] Arrival date & time: 07/03/22  1024     History  Chief Complaint  Patient presents with   Arm Injury    Amber Fox is a 46 y.o. female.  46 year old female presents after slip and fall and injury to her left wrist.  No other injury noted.  Pain at the wrist is sharp and worse with movement.  No numbness or tingling to her distal fingers or thumb.  Pain worse with movement and better with remaining still       Home Medications Prior to Admission medications   Medication Sig Start Date End Date Taking? Authorizing Provider  acetaminophen (TYLENOL) 500 MG tablet Take 1,000 mg by mouth every 6 (six) hours as needed for mild pain or moderate pain.    [provider]  amoxicillin-clavulanate (AUGMENTIN) 875-125 MG tablet Take 1 tablet by mouth every 12 (twelve) hours. 04/30/18   Caccavale, Sophia, PA-C      Allergies    Cortisone, Iron, and Sulfa antibiotics    Review of Systems   Review of Systems  All other systems reviewed and are negative.   Physical Exam Updated Vital Signs BP 130/85 (BP Location: Right Arm)   Pulse 96   Temp 98.5 F (36.9 C) (Oral)   Resp 20   SpO2 100%  Physical Exam Vitals and nursing note reviewed.  Constitutional:      General: She is not in acute distress.    Appearance: Normal appearance. She is well-developed. She is not toxic-appearing.  HENT:     Head: Normocephalic and atraumatic.  Eyes:     General: Lids are normal.     Conjunctiva/sclera: Conjunctivae normal.     Pupils: Pupils are equal, round, and reactive to light.  Neck:     Thyroid: No thyroid mass.     Trachea: No tracheal deviation.  Cardiovascular:     Rate and Rhythm: Normal rate and regular rhythm.     Heart sounds: Normal heart sounds. No murmur heard.    No gallop.  Pulmonary:     Effort: Pulmonary effort is normal. No respiratory distress.     Breath sounds: Normal  breath sounds. No stridor. No decreased breath sounds, wheezing, rhonchi or rales.  Abdominal:     General: There is no distension.     Palpations: Abdomen is soft.     Tenderness: There is no abdominal tenderness. There is no rebound.  Musculoskeletal:        General: No tenderness. Normal range of motion.     Cervical back: Normal range of motion and neck supple.     Comments: Limited range of motion at the left wrist.  Some deformity noted.  Neurovascular intact at the digits  Skin:    General: Skin is warm and dry.     Findings: No abrasion or rash.  Neurological:     Mental Status: She is alert and oriented to person, place, and time. Mental status is at baseline.     GCS: GCS eye subscore is 4. GCS verbal subscore is 5. GCS motor subscore is 6.     Cranial Nerves: No cranial nerve deficit.     Sensory: No sensory deficit.     Motor: Motor function is intact.  Psychiatric:        Attention and Perception: Attention normal.        Speech: Speech normal.        Behavior:  Behavior normal.     ED Results / Procedures / Treatments   Labs (all labs ordered are listed, but only abnormal results are displayed) Labs Reviewed - No data to display  EKG None  Radiology No results found.  Procedures Procedures    Medications Ordered in ED Medications - No data to display  ED Course/ Medical Decision Making/ A&P                           Medical Decision Making Amount and/or Complexity of Data Reviewed Radiology: ordered.   Patient's right wrist x-ray shows comminuted nondisplaced right wrist fracture.  Patient neurovascular tact.  Will place into a splint.  Will give orthopedic referral        Final Clinical Impression(s) / ED Diagnoses Final diagnoses:  None    Rx / DC Orders ED Discharge Orders     None         Lorre Nick, MD 07/03/22 1149

## 2023-01-14 ENCOUNTER — Other Ambulatory Visit: Payer: Self-pay | Admitting: Orthopedic Surgery

## 2023-01-14 DIAGNOSIS — M545 Low back pain, unspecified: Secondary | ICD-10-CM

## 2023-02-26 ENCOUNTER — Other Ambulatory Visit: Payer: 59

## 2023-02-27 ENCOUNTER — Inpatient Hospital Stay (HOSPITAL_COMMUNITY)
Admission: EM | Admit: 2023-02-27 | Discharge: 2023-03-01 | DRG: 418 | Disposition: A | Discharge status: Home / Self Care | Payer: Self-pay | Attending: Internal Medicine | Admitting: Internal Medicine

## 2023-02-27 ENCOUNTER — Emergency Department (HOSPITAL_COMMUNITY): Payer: Self-pay

## 2023-02-27 ENCOUNTER — Inpatient Hospital Stay (HOSPITAL_COMMUNITY): Payer: Self-pay | Admitting: Anesthesiology

## 2023-02-27 ENCOUNTER — Other Ambulatory Visit: Payer: Self-pay

## 2023-02-27 ENCOUNTER — Encounter (HOSPITAL_COMMUNITY)
Admission: EM | Disposition: A | Discharge status: Home / Self Care | Payer: Self-pay | Source: Home / Self Care | Attending: Internal Medicine

## 2023-02-27 ENCOUNTER — Inpatient Hospital Stay (HOSPITAL_COMMUNITY): Payer: Self-pay

## 2023-02-27 ENCOUNTER — Encounter (HOSPITAL_COMMUNITY): Payer: Self-pay

## 2023-02-27 DIAGNOSIS — Z820 Family history of epilepsy and other diseases of the nervous system: Secondary | ICD-10-CM

## 2023-02-27 DIAGNOSIS — Z825 Family history of asthma and other chronic lower respiratory diseases: Secondary | ICD-10-CM

## 2023-02-27 DIAGNOSIS — K8050 Calculus of bile duct without cholangitis or cholecystitis without obstruction: Secondary | ICD-10-CM | POA: Diagnosis present

## 2023-02-27 DIAGNOSIS — Z823 Family history of stroke: Secondary | ICD-10-CM

## 2023-02-27 DIAGNOSIS — Z885 Allergy status to narcotic agent status: Secondary | ICD-10-CM

## 2023-02-27 DIAGNOSIS — Z975 Presence of (intrauterine) contraceptive device: Secondary | ICD-10-CM

## 2023-02-27 DIAGNOSIS — N179 Acute kidney failure, unspecified: Secondary | ICD-10-CM | POA: Diagnosis present

## 2023-02-27 DIAGNOSIS — Z79899 Other long term (current) drug therapy: Secondary | ICD-10-CM

## 2023-02-27 DIAGNOSIS — D649 Anemia, unspecified: Secondary | ICD-10-CM | POA: Diagnosis present

## 2023-02-27 DIAGNOSIS — Z882 Allergy status to sulfonamides status: Secondary | ICD-10-CM

## 2023-02-27 DIAGNOSIS — K219 Gastro-esophageal reflux disease without esophagitis: Secondary | ICD-10-CM

## 2023-02-27 DIAGNOSIS — K8071 Calculus of gallbladder and bile duct without cholecystitis with obstruction: Principal | ICD-10-CM

## 2023-02-27 DIAGNOSIS — Z981 Arthrodesis status: Secondary | ICD-10-CM

## 2023-02-27 DIAGNOSIS — K8065 Calculus of gallbladder and bile duct with chronic cholecystitis with obstruction: Principal | ICD-10-CM | POA: Diagnosis present

## 2023-02-27 DIAGNOSIS — K5090 Crohn's disease, unspecified, without complications: Secondary | ICD-10-CM | POA: Diagnosis present

## 2023-02-27 HISTORY — PX: REMOVAL OF STONES: SHX5545

## 2023-02-27 HISTORY — PX: ERCP: SHX5425

## 2023-02-27 HISTORY — PX: SPHINCTEROTOMY: SHX5279

## 2023-02-27 LAB — URINALYSIS, ROUTINE W REFLEX MICROSCOPIC
Bilirubin Urine: NEGATIVE
Glucose, UA: NEGATIVE mg/dL
Hgb urine dipstick: NEGATIVE
Ketones, ur: NEGATIVE mg/dL
Leukocytes,Ua: NEGATIVE
Nitrite: NEGATIVE
Protein, ur: NEGATIVE mg/dL
Specific Gravity, Urine: 1.018 (ref 1.005–1.030)
pH: 5 (ref 5.0–8.0)

## 2023-02-27 LAB — CBC WITH DIFFERENTIAL/PLATELET
Abs Immature Granulocytes: 0.03 10*3/uL (ref 0.00–0.07)
Basophils Absolute: 0.1 10*3/uL (ref 0.0–0.1)
Basophils Relative: 1 %
Eosinophils Absolute: 0.1 10*3/uL (ref 0.0–0.5)
Eosinophils Relative: 2 %
HCT: 44.8 % (ref 36.0–46.0)
Hemoglobin: 14.9 g/dL (ref 12.0–15.0)
Immature Granulocytes: 0 %
Lymphocytes Relative: 23 %
Lymphs Abs: 1.7 10*3/uL (ref 0.7–4.0)
MCH: 28.8 pg (ref 26.0–34.0)
MCHC: 33.3 g/dL (ref 30.0–36.0)
MCV: 86.5 fL (ref 80.0–100.0)
Monocytes Absolute: 0.5 10*3/uL (ref 0.1–1.0)
Monocytes Relative: 7 %
Neutro Abs: 5 10*3/uL (ref 1.7–7.7)
Neutrophils Relative %: 67 %
Platelets: 231 10*3/uL (ref 150–400)
RBC: 5.18 MIL/uL — ABNORMAL HIGH (ref 3.87–5.11)
RDW: 12.9 % (ref 11.5–15.5)
WBC: 7.4 10*3/uL (ref 4.0–10.5)
nRBC: 0 % (ref 0.0–0.2)

## 2023-02-27 LAB — COMPREHENSIVE METABOLIC PANEL
ALT: 174 U/L — ABNORMAL HIGH (ref 0–44)
AST: 317 U/L — ABNORMAL HIGH (ref 15–41)
Albumin: 3.8 g/dL (ref 3.5–5.0)
Alkaline Phosphatase: 132 U/L — ABNORMAL HIGH (ref 38–126)
Anion gap: 6 (ref 5–15)
BUN: 8 mg/dL (ref 6–20)
CO2: 22 mmol/L (ref 22–32)
Calcium: 8.5 mg/dL — ABNORMAL LOW (ref 8.9–10.3)
Chloride: 108 mmol/L (ref 98–111)
Creatinine, Ser: 1.13 mg/dL — ABNORMAL HIGH (ref 0.44–1.00)
GFR, Estimated: >60 mL/min (ref >60)
Glucose, Bld: 95 mg/dL (ref 70–99)
Potassium: 3.8 mmol/L (ref 3.5–5.1)
Sodium: 136 mmol/L (ref 135–145)
Total Bilirubin: 2.2 mg/dL — ABNORMAL HIGH (ref 0.3–1.2)
Total Protein: 6.7 g/dL (ref 6.5–8.1)

## 2023-02-27 LAB — PREGNANCY, URINE: Preg Test, Ur: NEGATIVE

## 2023-02-27 LAB — LIPASE, BLOOD: Lipase: 52 U/L — ABNORMAL HIGH (ref 11–51)

## 2023-02-27 SURGERY — ERCP, WITH INTERVENTION IF INDICATED
Anesthesia: General

## 2023-02-27 MED ORDER — DROPERIDOL 2.5 MG/ML IJ SOLN
INTRAMUSCULAR | Status: DC | PRN
  Administered 2023-02-27: 1.25 mg via INTRAVENOUS

## 2023-02-27 MED ORDER — DEXAMETHASONE SODIUM PHOSPHATE 10 MG/ML IJ SOLN
INTRAMUSCULAR | Status: DC | PRN
  Administered 2023-02-27: 10 mg via INTRAVENOUS

## 2023-02-27 MED ORDER — DICLOFENAC SUPPOSITORY 100 MG
RECTAL | Status: DC | PRN
  Administered 2023-02-27: 100 mg via RECTAL

## 2023-02-27 MED ORDER — ONDANSETRON HCL 4 MG/2ML IJ SOLN: 4.0000 mg | Freq: Four times a day (QID) | INTRAMUSCULAR | Status: DC | PRN

## 2023-02-27 MED ORDER — FENTANYL CITRATE (PF) 100 MCG/2ML IJ SOLN
INTRAMUSCULAR | Status: AC
  Filled 2023-02-27: qty 2

## 2023-02-27 MED ORDER — PROPOFOL 10 MG/ML IV BOLUS
INTRAVENOUS | Status: DC | PRN
  Administered 2023-02-27: 120 mg via INTRAVENOUS

## 2023-02-27 MED ORDER — PIPERACILLIN-TAZOBACTAM 3.375 G IVPB
3.3750 g | INTRAVENOUS | Status: DC
  Filled 2023-02-27: qty 50

## 2023-02-27 MED ORDER — TRAZODONE HCL 50 MG PO TABS: 25.0000 mg | ORAL_TABLET | Freq: Every evening | ORAL | Status: DC | PRN

## 2023-02-27 MED ORDER — ROCURONIUM BROMIDE 10 MG/ML (PF) SYRINGE
PREFILLED_SYRINGE | INTRAVENOUS | Status: DC | PRN
  Administered 2023-02-27: 60 mg via INTRAVENOUS

## 2023-02-27 MED ORDER — CIPROFLOXACIN IN D5W 400 MG/200ML IV SOLN
400.0000 mg | Freq: Once | INTRAVENOUS | Status: AC
  Administered 2023-02-27: 400 mg via INTRAVENOUS

## 2023-02-27 MED ORDER — ONDANSETRON HCL 4 MG PO TABS: 4.0000 mg | ORAL_TABLET | Freq: Four times a day (QID) | ORAL | Status: DC | PRN

## 2023-02-27 MED ORDER — MIDAZOLAM HCL 2 MG/2ML IJ SOLN
INTRAMUSCULAR | Status: AC
  Filled 2023-02-27: qty 2

## 2023-02-27 MED ORDER — ACETAMINOPHEN 325 MG PO TABS
650.0000 mg | ORAL_TABLET | Freq: Four times a day (QID) | ORAL | Status: DC | PRN
  Administered 2023-02-27: 650 mg via ORAL
  Filled 2023-02-27: qty 2

## 2023-02-27 MED ORDER — POTASSIUM CHLORIDE IN NACL 20-0.9 MEQ/L-% IV SOLN
INTRAVENOUS | Status: DC
  Filled 2023-02-27 (×2): qty 1000

## 2023-02-27 MED ORDER — CIPROFLOXACIN IN D5W 400 MG/200ML IV SOLN
INTRAVENOUS | Status: AC
  Filled 2023-02-27: qty 200

## 2023-02-27 MED ORDER — DIPHENHYDRAMINE HCL 50 MG/ML IJ SOLN: 12.5000 mg | Freq: Four times a day (QID) | INTRAMUSCULAR | Status: DC | PRN

## 2023-02-27 MED ORDER — ONDANSETRON HCL 4 MG/2ML IJ SOLN
INTRAMUSCULAR | Status: DC | PRN
  Administered 2023-02-27: 4 mg via INTRAVENOUS

## 2023-02-27 MED ORDER — PHENYLEPHRINE 80 MCG/ML (10ML) SYRINGE FOR IV PUSH (FOR BLOOD PRESSURE SUPPORT)
PREFILLED_SYRINGE | INTRAVENOUS | Status: DC | PRN
  Administered 2023-02-27: 80 ug via INTRAVENOUS

## 2023-02-27 MED ORDER — PROPOFOL 10 MG/ML IV BOLUS
INTRAVENOUS | Status: AC
  Filled 2023-02-27: qty 20

## 2023-02-27 MED ORDER — MORPHINE SULFATE (PF) 2 MG/ML IV SOLN: 2.0000 mg | INTRAVENOUS | Status: DC | PRN

## 2023-02-27 MED ORDER — HYDROXYZINE HCL 25 MG PO TABS
50.0000 mg | ORAL_TABLET | Freq: Every evening | ORAL | Status: DC | PRN
  Administered 2023-02-28: 50 mg via ORAL
  Filled 2023-02-27: qty 2

## 2023-02-27 MED ORDER — LACTATED RINGERS IV SOLN: INTRAVENOUS | Status: DC

## 2023-02-27 MED ORDER — SUMATRIPTAN SUCCINATE 50 MG PO TABS
50.0000 mg | ORAL_TABLET | ORAL | Status: DC | PRN
  Filled 2023-02-27: qty 1

## 2023-02-27 MED ORDER — ALBUTEROL SULFATE (2.5 MG/3ML) 0.083% IN NEBU: 2.5000 mg | INHALATION_SOLUTION | RESPIRATORY_TRACT | Status: DC | PRN

## 2023-02-27 MED ORDER — GLUCAGON HCL RDNA (DIAGNOSTIC) 1 MG IJ SOLR
INTRAMUSCULAR | Status: AC
  Filled 2023-02-27: qty 2

## 2023-02-27 MED ORDER — GADOBUTROL 1 MMOL/ML IV SOLN
6.0000 mL | Freq: Once | INTRAVENOUS | Status: AC | PRN
  Administered 2023-02-27: 6 mL via INTRAVENOUS

## 2023-02-27 MED ORDER — HYDRALAZINE HCL 20 MG/ML IJ SOLN: 5.0000 mg | Freq: Four times a day (QID) | INTRAMUSCULAR | Status: DC | PRN

## 2023-02-27 MED ORDER — DROPERIDOL 2.5 MG/ML IJ SOLN
INTRAMUSCULAR | Status: AC
  Filled 2023-02-27: qty 2

## 2023-02-27 MED ORDER — FENTANYL CITRATE (PF) 100 MCG/2ML IJ SOLN
INTRAMUSCULAR | Status: DC | PRN
  Administered 2023-02-27: 100 ug via INTRAVENOUS

## 2023-02-27 MED ORDER — SODIUM CHLORIDE 0.9 % IV SOLN: INTRAVENOUS | Status: DC

## 2023-02-27 MED ORDER — PANTOPRAZOLE SODIUM 40 MG IV SOLR
40.0000 mg | Freq: Every day | INTRAVENOUS | Status: DC
  Administered 2023-02-28 – 2023-03-01 (×2): 40 mg via INTRAVENOUS
  Filled 2023-02-27 (×3): qty 10

## 2023-02-27 MED ORDER — SUGAMMADEX SODIUM 200 MG/2ML IV SOLN
INTRAVENOUS | Status: DC | PRN
  Administered 2023-02-27: 120 mg via INTRAVENOUS

## 2023-02-27 MED ORDER — LIDOCAINE 2% (20 MG/ML) 5 ML SYRINGE
INTRAMUSCULAR | Status: DC | PRN
  Administered 2023-02-27: 60 mg via INTRAVENOUS

## 2023-02-27 MED ORDER — MIDAZOLAM HCL 2 MG/2ML IJ SOLN
INTRAMUSCULAR | Status: DC | PRN
  Administered 2023-02-27: 2 mg via INTRAVENOUS

## 2023-02-27 MED ORDER — SODIUM CHLORIDE 0.9 % IV SOLN
INTRAVENOUS | Status: DC | PRN
  Administered 2023-02-27: 25 mL

## 2023-02-27 MED ORDER — DICLOFENAC SUPPOSITORY 100 MG
RECTAL | Status: AC
  Filled 2023-02-27: qty 1

## 2023-02-27 NOTE — ED Notes (Signed)
Called lab regarding urine pregnancy - they advised they are "looking for it."

## 2023-02-27 NOTE — Anesthesia Procedure Notes (Signed)
Procedure Name: Intubation Date/Time: 02/27/2023 2:04 PM  Performed by: Sharlette Dense, CRNAPre-anesthesia Checklist: Patient identified, Emergency Drugs available, Suction available and Patient being monitored Patient Re-evaluated:Patient Re-evaluated prior to induction Oxygen Delivery Method: Circle system utilized Preoxygenation: Pre-oxygenation with 100% oxygen Induction Type: IV induction Ventilation: Mask ventilation without difficulty Laryngoscope Size: Miller and 2 Grade View: Grade I Tube type: Oral Tube size: 7.0 mm Number of attempts: 1 Airway Equipment and Method: Stylet Placement Confirmation: ETT inserted through vocal cords under direct vision, positive ETCO2 and breath sounds checked- equal and bilateral Secured at: 19 cm Tube secured with: Tape Dental Injury: Teeth and Oropharynx as per pre-operative assessment

## 2023-02-27 NOTE — ED Notes (Signed)
Attempted but unable to provide urine sample.

## 2023-02-27 NOTE — Consult Note (Signed)
Consult Note  Amber Fox Nov 19, 1976  [id number].    Requesting MD: Dr. Maryan Rued Chief Complaint/Reason for Consult: Cholelithiasis, choledocholithiasis  HPI:  47 y.o. female with medical history significant for chron's, GERD who presented to [facility] ED with abdominal pain. Pain started 3 days ago and occurs after eating and is associated with nausea and vomiting.   Work up in ED concerning for cholelithiasis and choledocholithiasis  Currently she is not having significant pain, nausea, or emesis. She is thirsty. Mother is at bedside  Substance use: none Allergies: itching with opioids Blood thinners: none Past Surgeries: cesarean section  ROS: ROS reviewed and as above  Family History  Problem Relation Age of Onset   Colitis Mother    Stroke Maternal Grandfather    Alzheimer's disease Paternal Grandmother    Emphysema Paternal Grandfather     Past Medical History:  Diagnosis Date   Allergy    Anemia    Crohn's disease ([place])    GERD (gastroesophageal reflux disease)    Headache(784.0)    MIGRAINE AND TENSION   Joint pain    DISINTRIGATING SI JOINT    Past Surgical History:  Procedure Laterality Date   CESAREAN SECTION  2010   COLONOSCOPY WITH PROPOFOL N/A 05/24/2014   Procedure: COLONOSCOPY WITH PROPOFOL;  Surgeon: Garlan Fair, MD;  Location: [facility];  Service: Endoscopy;  Laterality: N/A;   SACROILIAC JOINT FUSION Right 08/01/2016   Procedure: RIGHT SIDED SACROILIAC JOINT FUSION;  Surgeon: Phylliss Bob, MD;  Location: [facility];  Service: Orthopedics;  Laterality: Right;  RIGHT SIDED SACROILIAC JOINT FUSION    Social History:  reports that she has never smoked. She has never used smokeless tobacco. She reports that she does not drink alcohol and does not use drugs.  Allergies:  Allergies  Allergen Reactions   Cortisone Other (See Comments)    Pain, not effective    Tramadol Itching   Hydrocodone Rash    not hives, no sob   Iron Other (See  Comments)    HEADACHE   Oxycodone Rash   Sulfa Antibiotics Itching    (Not in a hospital admission)   Blood pressure 124/70, pulse 66, temperature 97.9 F (36.6 C), temperature source Oral, resp. rate 16, height '5\' 4"'$  (1.626 m), weight 59 kg, SpO2 100 %. Physical Exam: General: pleasant, WD, female who is laying in bed in NAD HEENT: head is normocephalic, atraumatic.  Sclera are noninjected.  Pupils equal and round. EOMs intact.  Ears and nose without any masses or lesions.  Mouth is pink and moist Lungs: Respiratory effort nonlabored Abd: soft, ND, no masses, hernias, or organomegaly. Mild TTP in epigastrium and RUQ without rebound or guarding  MSK: all 4 extremities are symmetrical with no cyanosis, clubbing, or edema. Skin: warm and dry with no masses, lesions, or rashes Neuro: Cranial nerves 2-12 grossly intact, sensation is normal throughout Psych: A&Ox3 with an appropriate affect.    Results for orders placed or performed during the hospital encounter of 02/27/23 (from the past 48 hour(s))  Lipase, blood     Status: Abnormal   Collection Time: 02/27/23  1:36 AM  Result Value Ref Range   Lipase 52 (H) 11 - 51 U/L    Comment: Performed at [facility], [place] [address], [place], [place] [address]  Comprehensive metabolic panel     Status: Abnormal   Collection Time: 02/27/23  1:36 AM  Result Value Ref Range   Sodium 136  135 - 145 mmol/L   Potassium 3.8 3.5 - 5.1 mmol/L   Chloride 108 98 - 111 mmol/L   CO2 22 22 - 32 mmol/L   Glucose, Bld 95 70 - 99 mg/dL    Comment: Glucose reference range applies only to samples taken after fasting for at least 8 hours.   BUN 8 6 - 20 mg/dL   Creatinine, Ser 1.13 (H) 0.44 - 1.00 mg/dL   Calcium 8.5 (L) 8.9 - 10.3 mg/dL   Total Protein 6.7 6.5 - 8.1 g/dL   Albumin 3.8 3.5 - 5.0 g/dL   AST 317 (H) 15 - 41 U/L   ALT 174 (H) 0 - 44 U/L   Alkaline Phosphatase 132 (H) 38 - 126 U/L   Total Bilirubin 2.2 (H) 0.3 - 1.2 mg/dL    GFR, Estimated >60 >60 mL/min    Comment: (NOTE) Calculated using the CKD-EPI Creatinine Equation (2021)    Anion gap 6 5 - 15    Comment: Performed at [facility], [place] [address], [place], [place] [address]  CBC with Differential     Status: Abnormal   Collection Time: 02/27/23  1:36 AM  Result Value Ref Range   WBC 7.4 4.0 - 10.5 K/uL   RBC 5.18 (H) 3.87 - 5.11 MIL/uL   Hemoglobin 14.9 12.0 - 15.0 g/dL   HCT 44.8 36.0 - 46.0 %   MCV 86.5 80.0 - 100.0 fL   MCH 28.8 26.0 - 34.0 pg   MCHC 33.3 30.0 - 36.0 g/dL   RDW 12.9 11.5 - 15.5 %   Platelets 231 150 - 400 K/uL   nRBC 0.0 0.0 - 0.2 %   Neutrophils Relative % 67 %   Neutro Abs 5.0 1.7 - 7.7 K/uL   Lymphocytes Relative 23 %   Lymphs Abs 1.7 0.7 - 4.0 K/uL   Monocytes Relative 7 %   Monocytes Absolute 0.5 0.1 - 1.0 K/uL   Eosinophils Relative 2 %   Eosinophils Absolute 0.1 0.0 - 0.5 K/uL   Basophils Relative 1 %   Basophils Absolute 0.1 0.0 - 0.1 K/uL   Immature Granulocytes 0 %   Abs Immature Granulocytes 0.03 0.00 - 0.07 K/uL    Comment: Performed at [facility], [place] [address], [place], [place] [address]  Urinalysis, Routine w reflex microscopic -Urine, Clean Catch     Status: Abnormal   Collection Time: 02/27/23  3:36 AM  Result Value Ref Range   Color, Urine AMBER (A) YELLOW    Comment: BIOCHEMICALS MAY BE AFFECTED BY COLOR   APPearance CLEAR CLEAR   Specific Gravity, Urine 1.018 1.005 - 1.030   pH 5.0 5.0 - 8.0   Glucose, UA NEGATIVE NEGATIVE mg/dL   Hgb urine dipstick NEGATIVE NEGATIVE   Bilirubin Urine NEGATIVE NEGATIVE   Ketones, ur NEGATIVE NEGATIVE mg/dL   Protein, ur NEGATIVE NEGATIVE mg/dL   Nitrite NEGATIVE NEGATIVE   Leukocytes,Ua NEGATIVE NEGATIVE    Comment: Performed at [facility], [place] [address], [place], [place] [address]  Pregnancy, urine     Status: None   Collection Time: 02/27/23  4:47 AM  Result Value Ref Range   Preg Test, Ur  NEGATIVE NEGATIVE    Comment:        THE SENSITIVITY OF THIS METHODOLOGY IS >20 mIU/mL. Performed at [facility], [place] [address], [place], [place] [address]    MR ABDOMEN MRCP W WO CONTAST  Result Date: 02/27/2023 CLINICAL  DATA:  Evaluate for bile duct obstruction. EXAM: MRI ABDOMEN WITHOUT AND WITH CONTRAST (INCLUDING MRCP) TECHNIQUE: Multiplanar multisequence MR imaging of the abdomen was performed both before and after the administration of intravenous contrast. Heavily T2-weighted images of the biliary and pancreatic ducts were obtained, and three-dimensional MRCP images were rendered by post processing. CONTRAST:  6m GADAVIST GADOBUTROL 1 MMOL/ML IV SOLN COMPARISON:  Gallbladder sonogram from earlier today FINDINGS: Lower chest: No acute findings. Hepatobiliary: Mild hepatic steatosis. No suspicious enhancing liver lesions. Multiple small stones are identified layering within the gallbladder measuring up to 4 mm. Upper limits of normal common bile duct measuring 6 mm. Filling defect within the distal CBD compatible with choledocholithiasis this measures 5 mm, image 50/9. Slightly more distal is a second tiny stone just before the ampulla measuring 3 mm, image 51/9. Pancreas: No mass, inflammatory changes, or other parenchymal abnormality identified. Spleen:  Within normal limits in size and appearance. Adrenals/Urinary Tract: No masses identified. No evidence of hydronephrosis. Stomach/Bowel: Visualized portions within the abdomen are unremarkable. Vascular/Lymphatic: No pathologically enlarged lymph nodes identified. No abdominal aortic aneurysm demonstrated. Other:  There is no ascites or focal fluid collections. Musculoskeletal: No suspicious bone lesions identified. IMPRESSION: 1. Gallstones and choledocholithiasis. Two stones within the distal common bile duct measure up to 5 mm. 2. No gallbladder wall thickening or pericholecystic fluid noted. 3. Mild hepatic steatosis.  Electronically Signed   By: TKerby MoorsM.D.   On: 02/27/2023 09:22   MR 3D Recon At Scanner  Result Date: 02/27/2023 CLINICAL DATA:  Evaluate for bile duct obstruction. EXAM: MRI ABDOMEN WITHOUT AND WITH CONTRAST (INCLUDING MRCP) TECHNIQUE: Multiplanar multisequence MR imaging of the abdomen was performed both before and after the administration of intravenous contrast. Heavily T2-weighted images of the biliary and pancreatic ducts were obtained, and three-dimensional MRCP images were rendered by post processing. CONTRAST:  628mGADAVIST GADOBUTROL 1 MMOL/ML IV SOLN COMPARISON:  Gallbladder sonogram from earlier today FINDINGS: Lower chest: No acute findings. Hepatobiliary: Mild hepatic steatosis. No suspicious enhancing liver lesions. Multiple small stones are identified layering within the gallbladder measuring up to 4 mm. Upper limits of normal common bile duct measuring 6 mm. Filling defect within the distal CBD compatible with choledocholithiasis this measures 5 mm, image 50/9. Slightly more distal is a second tiny stone just before the ampulla measuring 3 mm, image 51/9. Pancreas: No mass, inflammatory changes, or other parenchymal abnormality identified. Spleen:  Within normal limits in size and appearance. Adrenals/Urinary Tract: No masses identified. No evidence of hydronephrosis. Stomach/Bowel: Visualized portions within the abdomen are unremarkable. Vascular/Lymphatic: No pathologically enlarged lymph nodes identified. No abdominal aortic aneurysm demonstrated. Other:  There is no ascites or focal fluid collections. Musculoskeletal: No suspicious bone lesions identified. IMPRESSION: 1. Gallstones and choledocholithiasis. Two stones within the distal common bile duct measure up to 5 mm. 2. No gallbladder wall thickening or pericholecystic fluid noted. 3. Mild hepatic steatosis. Electronically Signed   By: TaKerby Moors.D.   On: 02/27/2023 09:22   USKoreabdomen Limited RUQ (LIVER/GB)  Result  Date: 02/27/2023 CLINICAL DATA:  Right upper quadrant pain EXAM: ULTRASOUND ABDOMEN LIMITED RIGHT UPPER QUADRANT COMPARISON:  None Available. FINDINGS: Gallbladder: Multiple stones are identified within the gallbladder. These measure up to 6 mm in diameter. Gallbladder wall is upper limits of normal at 3 mm. No pericholecystic fluid or sonographic Murphy's sign. Common bile duct: Diameter: 7 mm. Filling defects within the CBD are noted concerning for choledocholithiasis. Liver: No focal lesion identified.  Within normal limits in parenchymal echogenicity. Portal vein is patent on color Doppler imaging with normal direction of blood flow towards the liver. Other: None. IMPRESSION: 1. Gallstones. Gallbladder wall thickness is upper limits of normal at 3 mm. No pericholecystic fluid or sonographic Murphy's sign. 2. Common bile duct is dilated to 7 mm. Filling defects are noted within the CBD concerning for choledocholithiasis. MRI/MRCP may be helpful to confirm. Electronically Signed   By: Kerby Moors M.D.   On: 02/27/2023 06:26   DG Chest 2 View  Result Date: 02/27/2023 CLINICAL DATA:  Shortness of breath and chest pain EXAM: CHEST - 2 VIEW COMPARISON:  07/25/2016 FINDINGS: The heart size and mediastinal contours are within normal limits. Both lungs are clear. The visualized skeletal structures are unremarkable. IMPRESSION: No active cardiopulmonary disease. Electronically Signed   By: Placido Sou M.D.   On: 02/27/2023 01:53      Assessment/Plan Cholelithiasis and choledocholithiasis  Patient seen and examined and relevant labs and imaging reviewed with workup and exam consistent with the above. Do not suspect cholecystitis. MRCP with filling defect in CBD 5 mm. GI consulted and planning ERCP tomorrow. Recommend laparoscopic cholecystectomy to follow ERCP for definitve management and prevention of future episodes.   I have explained the procedure, risks, and aftercare of cholecystectomy.  Risks  include but are not limited to bleeding, infection, wound problems, anesthesia, diarrhea, bile leak, injury to common bile duct/liver/intestine.  She seems to understand and agrees to proceed.  FEN: CLD ID: none VTE: okay for chemical prophylaxis from surgical standpoint  I reviewed ED provider notes, hospitalist notes, last 24 h vitals and pain scores, last 48 h intake and output, last 24 h labs and trends, and last 24 h imaging results.    Winferd Humphrey, [facility] 02/27/2023, 12:09 PM Please see Amion for pager number during day hours 7:00am-4:30pm

## 2023-02-27 NOTE — ED Provider Notes (Signed)
Assumed care from Dr. Florina Ou at 7 AM.  Patient's MRCP returned and patient has still 2 stones within the distal common bile duct measuring up to 5 mm but no gallbladder wall thickening or pericholecystic fluid.  In the setting of elevated LFTs, dilated common bile duct, retained stones and on repeat evaluation patient is having some minimal pain at this time will discuss with general surgery.  MRCP with 2 retained stones.  GI consulted and pt admitted to medicine.     Blanchie Dessert, MD 03/03/23 [phone]

## 2023-02-27 NOTE — H&P (Signed)
History and Physical  Amber Fox [id number] DOB: 08-Aug-1976 DOA: 02/27/2023   PCP: Sueanne Margarita, DO   Patient coming from: Home   Chief Complaint: Abd Pain   HPI: Amber Fox is a 47 y.o. female with medical history significant for suspected Crohn's disease, GERD, migraine headaches, being admitted to the hospital with choledocholithiasis.  Patient provides the history, says that she in retrospect may have had intermittent abdominal cramping, pain for the last few months, however starting 2 days ago, and for the last 3 nights, she has been having severe nonradiating burning type epigastric pain which last about 4 to 5 hours.  There is some associated nausea, she threw up on Monday, but has not vomited since then.  She decided to come to the ER today since the pain was not improving.  ED Course: Upon evaluation in the emergency department, initial vitals were 98.9, pulse rate 109 respiration 17 blood pressure 133/100.  Lab work revealed normal CBC, normal BMP except for creatinine 1.13 up from baseline normal.  Review of Systems: Please see HPI for pertinent positives and negatives. A complete 10 system review of systems are otherwise negative.  Past Medical History:  Diagnosis Date   Allergy    Anemia    Crohn's disease ([place])    GERD (gastroesophageal reflux disease)    Headache(784.0)    MIGRAINE AND TENSION   Joint pain    DISINTRIGATING SI JOINT   Past Surgical History:  Procedure Laterality Date   CESAREAN SECTION  2010   COLONOSCOPY WITH PROPOFOL N/A 05/24/2014   Procedure: COLONOSCOPY WITH PROPOFOL;  Surgeon: Garlan Fair, MD;  Location: [facility];  Service: Endoscopy;  Laterality: N/A;   SACROILIAC JOINT FUSION Right 08/01/2016   Procedure: RIGHT SIDED SACROILIAC JOINT FUSION;  Surgeon: Phylliss Bob, MD;  Location: [place];  Service: Orthopedics;  Laterality: Right;  RIGHT SIDED SACROILIAC JOINT FUSION    Social History:  reports that she has  never smoked. She has never used smokeless tobacco. She reports that she does not drink alcohol and does not use drugs.   Allergies  Allergen Reactions   Cortisone Other (See Comments)    Pain, not effective    Tramadol Itching   Hydrocodone Rash    not hives, no sob   Iron Other (See Comments)    HEADACHE   Oxycodone Rash   Sulfa Antibiotics Itching    Family History  Problem Relation Age of Onset   Colitis Mother    Stroke Maternal Grandfather    Alzheimer's disease Paternal Grandmother    Emphysema Paternal Grandfather      Prior to Admission medications   Medication Sig Start Date End Date Taking? Authorizing Provider  acetaminophen (TYLENOL) 650 MG CR tablet Take 1,300 mg by mouth 2 (two) times daily as needed for pain.   Yes [provider]  hydrOXYzine (VISTARIL) 25 MG capsule Take 50 mg by mouth at bedtime as needed (sleep).   Yes [provider]  levonorgestrel (MIRENA, 52 MG,) 20 MCG/DAY IUD 1 each by Intrauterine route continuous. 11/27/20  Yes [provider]  meloxicam (MOBIC) 15 MG tablet Take 15 mg by mouth as needed for pain. 02/02/23  Yes [provider]  pantoprazole (PROTONIX) 40 MG tablet Take 40 mg by mouth 2 (two) times daily as needed (reflux).   Yes [provider]  SUMAtriptan (IMITREX) 50 MG tablet Take 50 mg by mouth every 2 (two) hours as needed for migraine or headache.  Yes [provider]    Physical Exam: BP 109/68   Pulse (!) 109   Temp 98.7 F (37.1 C)   Resp 16   Ht '5\' 4"'$  (1.626 m)   Wt 59 kg   SpO2 100%   BMI 22.31 kg/m   General:  Alert, oriented, calm, in no acute distress resting in a stretcher in the ER with her mother at the bedside. Eyes: EOMI, clear conjuctivae, white sclerea Neck: supple, no masses, trachea mildline  Cardiovascular: RRR, no murmurs or rubs, no peripheral edema  Respiratory: clear to auscultation bilaterally, no wheezes, no crackles  Abdomen: soft,  nontender, nondistended, normal bowel tones heard  Skin: dry, no rashes  Musculoskeletal: no joint effusions, normal range of motion  Psychiatric: appropriate affect, normal speech  Neurologic: extraocular muscles intact, clear speech, moving all extremities with intact sensorium          Labs on Admission:  Basic Metabolic Panel: Recent Labs  Lab 02/27/23 0136  NA 136  K 3.8  CL 108  CO2 22  GLUCOSE 95  BUN 8  CREATININE 1.13*  CALCIUM 8.5*   Liver Function Tests: Recent Labs  Lab 02/27/23 0136  AST 317*  ALT 174*  ALKPHOS 132*  BILITOT 2.2*  PROT 6.7  ALBUMIN 3.8   Recent Labs  Lab 02/27/23 0136  LIPASE 52*   No results for input(s): "AMMONIA" in the last 168 hours. CBC: Recent Labs  Lab 02/27/23 0136  WBC 7.4  NEUTROABS 5.0  HGB 14.9  HCT 44.8  MCV 86.5  PLT 231   Cardiac Enzymes: No results for input(s): "CKTOTAL", "CKMB", "CKMBINDEX", "TROPONINI" in the last 168 hours.  BNP (last 3 results) No results for input(s): "BNP" in the last 8760 hours.  ProBNP (last 3 results) No results for input(s): "PROBNP" in the last 8760 hours.  CBG: No results for input(s): "GLUCAP" in the last 168 hours.  Radiological Exams on Admission: MR ABDOMEN MRCP W WO CONTAST  Result Date: 02/27/2023 CLINICAL DATA:  Evaluate for bile duct obstruction. EXAM: MRI ABDOMEN WITHOUT AND WITH CONTRAST (INCLUDING MRCP) TECHNIQUE: Multiplanar multisequence MR imaging of the abdomen was performed both before and after the administration of intravenous contrast. Heavily T2-weighted images of the biliary and pancreatic ducts were obtained, and three-dimensional MRCP images were rendered by post processing. CONTRAST:  39m GADAVIST GADOBUTROL 1 MMOL/ML IV SOLN COMPARISON:  Gallbladder sonogram from earlier today FINDINGS: Lower chest: No acute findings. Hepatobiliary: Mild hepatic steatosis. No suspicious enhancing liver lesions. Multiple small stones are identified layering within the  gallbladder measuring up to 4 mm. Upper limits of normal common bile duct measuring 6 mm. Filling defect within the distal CBD compatible with choledocholithiasis this measures 5 mm, image 50/9. Slightly more distal is a second tiny stone just before the ampulla measuring 3 mm, image 51/9. Pancreas: No mass, inflammatory changes, or other parenchymal abnormality identified. Spleen:  Within normal limits in size and appearance. Adrenals/Urinary Tract: No masses identified. No evidence of hydronephrosis. Stomach/Bowel: Visualized portions within the abdomen are unremarkable. Vascular/Lymphatic: No pathologically enlarged lymph nodes identified. No abdominal aortic aneurysm demonstrated. Other:  There is no ascites or focal fluid collections. Musculoskeletal: No suspicious bone lesions identified. IMPRESSION: 1. Gallstones and choledocholithiasis. Two stones within the distal common bile duct measure up to 5 mm. 2. No gallbladder wall thickening or pericholecystic fluid noted. 3. Mild hepatic steatosis. Electronically Signed   By: TKerby MoorsM.D.   On: 02/27/2023 09:22   MR  3D Recon At Scanner  Result Date: 02/27/2023 CLINICAL DATA:  Evaluate for bile duct obstruction. EXAM: MRI ABDOMEN WITHOUT AND WITH CONTRAST (INCLUDING MRCP) TECHNIQUE: Multiplanar multisequence MR imaging of the abdomen was performed both before and after the administration of intravenous contrast. Heavily T2-weighted images of the biliary and pancreatic ducts were obtained, and three-dimensional MRCP images were rendered by post processing. CONTRAST:  40m GADAVIST GADOBUTROL 1 MMOL/ML IV SOLN COMPARISON:  Gallbladder sonogram from earlier today FINDINGS: Lower chest: No acute findings. Hepatobiliary: Mild hepatic steatosis. No suspicious enhancing liver lesions. Multiple small stones are identified layering within the gallbladder measuring up to 4 mm. Upper limits of normal common bile duct measuring 6 mm. Filling defect within the distal  CBD compatible with choledocholithiasis this measures 5 mm, image 50/9. Slightly more distal is a second tiny stone just before the ampulla measuring 3 mm, image 51/9. Pancreas: No mass, inflammatory changes, or other parenchymal abnormality identified. Spleen:  Within normal limits in size and appearance. Adrenals/Urinary Tract: No masses identified. No evidence of hydronephrosis. Stomach/Bowel: Visualized portions within the abdomen are unremarkable. Vascular/Lymphatic: No pathologically enlarged lymph nodes identified. No abdominal aortic aneurysm demonstrated. Other:  There is no ascites or focal fluid collections. Musculoskeletal: No suspicious bone lesions identified. IMPRESSION: 1. Gallstones and choledocholithiasis. Two stones within the distal common bile duct measure up to 5 mm. 2. No gallbladder wall thickening or pericholecystic fluid noted. 3. Mild hepatic steatosis. Electronically Signed   By: TKerby MoorsM.D.   On: 02/27/2023 09:22   UKoreaAbdomen Limited RUQ (LIVER/GB)  Result Date: 02/27/2023 CLINICAL DATA:  Right upper quadrant pain EXAM: ULTRASOUND ABDOMEN LIMITED RIGHT UPPER QUADRANT COMPARISON:  None Available. FINDINGS: Gallbladder: Multiple stones are identified within the gallbladder. These measure up to 6 mm in diameter. Gallbladder wall is upper limits of normal at 3 mm. No pericholecystic fluid or sonographic Murphy's sign. Common bile duct: Diameter: 7 mm. Filling defects within the CBD are noted concerning for choledocholithiasis. Liver: No focal lesion identified. Within normal limits in parenchymal echogenicity. Portal vein is patent on color Doppler imaging with normal direction of blood flow towards the liver. Other: None. IMPRESSION: 1. Gallstones. Gallbladder wall thickness is upper limits of normal at 3 mm. No pericholecystic fluid or sonographic Murphy's sign. 2. Common bile duct is dilated to 7 mm. Filling defects are noted within the CBD concerning for choledocholithiasis.  MRI/MRCP may be helpful to confirm. Electronically Signed   By: TKerby MoorsM.D.   On: 02/27/2023 06:26   DG Chest 2 View  Result Date: 02/27/2023 CLINICAL DATA:  Shortness of breath and chest pain EXAM: CHEST - 2 VIEW COMPARISON:  07/25/2016 FINDINGS: The heart size and mediastinal contours are within normal limits. Both lungs are clear. The visualized skeletal structures are unremarkable. IMPRESSION: No active cardiopulmonary disease. Electronically Signed   By: TPlacido SouM.D.   On: 02/27/2023 01:53    Assessment/Plan Principal Problem:   Choledocholithiasis -this is a source of her abdominal pain and discomfort as well as nausea.  Patient is hemodynamically stable, not septic. -Inpatient admission -Pain and nausea control - IV fluids - general surgery has been consulted and will follow  - GI has been consulted as well, anticipate she will require ERCP Active Problems:   Crohn's disease ([place]   GERD (gastroesophageal reflux disease) - IV PPI AKI - likely from reduced oral intake, avoid nephrotoxins, will hydrate as above and recheck AM labs  DVT prophylaxis: SCDs   Code Status:  FULL   Family Communication: Mother at bedside.   Consults called: General Surgery and GI  Admission status: Inpatient  Time spent: 43 minutes  Zhion Pevehouse Neva Seat MD [facility] Pager [phone]  If 7PM-7AM, please contact night-coverage [email or web address] Password [facility]  02/27/2023, 10:56 AM

## 2023-02-27 NOTE — Transfer of Care (Signed)
Immediate Anesthesia Transfer of Care Note  Patient: JETTE WHITMIRE  Procedure(s) Performed: ENDOSCOPIC RETROGRADE CHOLANGIOPANCREATOGRAPHY (ERCP) SPHINCTEROTOMY REMOVAL OF STONES  Patient Location: Endoscopy Unit  Anesthesia Type:General  Level of Consciousness: awake and alert   Airway & Oxygen Therapy: Patient Spontanous Breathing and Patient connected to face mask oxygen  Post-op Assessment: Report given to RN and Post -op Vital signs reviewed and stable  Post vital signs: Reviewed and stable  Last Vitals:  Vitals Value Taken Time  BP 127/69 02/27/23 1456  Temp    Pulse 64 02/27/23 1457  Resp 15 02/27/23 1457  SpO2 100 % 02/27/23 1457  Vitals shown include unvalidated device data.  Last Pain:  Vitals:   02/27/23 1330  TempSrc: Temporal  PainSc: 0-No pain      Patients Stated Pain Goal: 0 ([id number])  Complications: No notable events documented.

## 2023-02-27 NOTE — Op Note (Signed)
[facility] Patient Name: Amber Fox Procedure Date: 02/27/2023 MRN: [id number] Attending MD: Carol Ada , MD, [id number] Date of Birth: 31-Jan-1976 CSN: [id number] Age: 47 Admit Type: Outpatient Procedure:                ERCP Indications:              Common bile duct stone(s) Providers:                Carol Ada, MD, Jeanella Cara, RN,                            William Dalton, Technician Referring MD:              Medicines:                General Anesthesia Complications:            No immediate complications. Estimated Blood Loss:     Estimated blood loss: none. Procedure:                Pre-Anesthesia Assessment:                           - Prior to the procedure, a History and Physical                            was performed, and patient medications and                            allergies were reviewed. The patient's tolerance of                            previous anesthesia was also reviewed. The risks                            and benefits of the procedure and the sedation                            options and risks were discussed with the patient.                            All questions were answered, and informed consent                            was obtained. Prior Anticoagulants: The patient has                            taken no anticoagulant or antiplatelet agents. ASA                            Grade Assessment: I - A normal, healthy patient.                            After reviewing the risks and benefits, the patient  was deemed in satisfactory condition to undergo the                            procedure.                           - Sedation was administered by an anesthesia                            professional. General anesthesia was attained.                           After obtaining informed consent, the scope was                            passed under direct vision. Throughout the                             procedure, the patient's blood pressure, pulse, and                            oxygen saturations were monitored continuously. The                            [id number] Olympus duodenoscope was                            introduced through the mouth, and used to inject                            contrast into and used to inject contrast into the                            bile duct. The ERCP was accomplished without                            difficulty. The patient tolerated the procedure                            well. Scope In: Scope Out: Findings:      The major papilla was normal. The bile duct was deeply cannulated with       the short-nosed traction sphincterotome. Contrast was injected. I       personally interpreted the bile duct images. There was brisk flow of       contrast through the ducts. Image quality was excellent. Contrast       extended to the entire biliary tree. The common bile duct contained few,       the largest of which was small in diameter. The common bile duct was       moderately dilated and diffusely dilated, acquired. The largest diameter       was 9 mm. A short 0.035 inch Soft Jagwire was passed into the biliary       tree. A 10 mm biliary sphincterotomy was made with a traction (standard)       sphincterotome using ERBE  electrocautery. There was no       post-sphincterotomy bleeding. The biliary tree was swept with a 12 mm       balloon starting at the bifurcation. Two stones were removed. No stones       remained.      Cannulation of the CBD was achieved with the third attempt. There was no       cannulation of the PD. The guidewire was secured in the right       intrahepatic ducts. Contrast injection revealed a dilated CBD at       approximately 9 mm. A few distal filling defects were found. Contrast       was noted to be going into the cystic duct. A 1 cm sphincterotomy was       created and the CBD was swept 4 times. Only  two pale colored stones were       removed with ease. The final occlusion cholangiogram was negative for       any filling defects. The procedure was then terminated. Fluoroscopic       images were obtained. Impression:               - The major papilla appeared normal.                           - The common bile duct was moderately dilated,                            acquired.                           - Choledocholithiasis was found. Complete removal                            was accomplished by biliary sphincterotomy and                            balloon extraction.                           - A biliary sphincterotomy was performed.                           - The biliary tree was swept. Moderate Sedation:      Not Applicable - Patient had care per Anesthesia. Recommendation:           - Return patient to hospital ward for ongoing care.                           - Resume regular diet.                           - [facility] will resume care.                           - Lap chole per Surgery. Procedure Code(s):        --- Professional ---  [phone], Endoscopic retrograde                            cholangiopancreatography (ERCP); with removal of                            calculi/debris from biliary/pancreatic duct(s)                           43262, Endoscopic retrograde                            cholangiopancreatography (ERCP); with                            sphincterotomy/papillotomy                           [phone], Endoscopic catheterization of the biliary                            ductal system, radiological supervision and                            interpretation Diagnosis Code(s):        --- Professional ---                           K80.50, Calculus of bile duct without cholangitis                            or cholecystitis without obstruction                           K83.8, Other specified diseases of biliary tract CPT copyright 2022 American  Medical Association. All rights reserved. The codes documented in this report are preliminary and upon coder review may  be revised to meet current compliance requirements. Carol Ada, MD Carol Ada, MD 02/27/2023 2:59:07 PM This report has been signed electronically. Number of Addenda: 0

## 2023-02-27 NOTE — Anesthesia Preprocedure Evaluation (Signed)
Anesthesia Evaluation  Patient identified by MRN, date of birth, ID band Patient awake    Reviewed: Allergy & Precautions, H&P , NPO status , Patient's Chart, lab work & pertinent test results  History of Anesthesia Complications (+) PONV and history of anesthetic complications  Airway Mallampati: II  TM Distance: >3 FB Neck ROM: Full    Dental  (+) Dental Advisory Given, Teeth Intact   Pulmonary neg pulmonary ROS   Pulmonary exam normal breath sounds clear to auscultation       Cardiovascular Exercise Tolerance: Good negative cardio ROS  Rhythm:Regular Rate:Normal     Neuro/Psych  Headaches  negative psych ROS   GI/Hepatic Neg liver ROS,GERD  ,,Crohn's disease   Endo/Other  negative endocrine ROS    Renal/GU negative Renal ROS  negative genitourinary   Musculoskeletal negative musculoskeletal ROS (+)    Abdominal  (+) - obese  Peds negative pediatric ROS (+)  Hematology negative hematology ROS (+)   Anesthesia Other Findings   Reproductive/Obstetrics negative OB ROS                             Anesthesia Physical Anesthesia Plan  ASA: II  Anesthesia Plan: General   Post-op Pain Management:    Induction: Intravenous  PONV Risk Score and Plan: 4 or greater and Ondansetron, Dexamethasone, Midazolam, Droperidol and Treatment may vary due to age or medical condition  Airway Management Planned: Oral ETT  Additional Equipment:   Intra-op Plan:   Post-operative Plan: Extubation in OR  Informed Consent: I have reviewed the patients History and Physical, chart, labs and discussed the procedure including the risks, benefits and alternatives for the proposed anesthesia with the patient or authorized representative who has indicated his/her understanding and acceptance.     Dental advisory given  Plan Discussed with:   Anesthesia Plan Comments:         Anesthesia Quick  Evaluation

## 2023-02-27 NOTE — Consult Note (Signed)
Referring Provider: ED Primary Care Physician:  Sueanne Margarita, DO Primary Gastroenterologist: Sadie Haber PCP/former patient of Dr. Wynetta Emery  Reason for Consultation: Cholelithiasis  HPI: Amber Fox is a 47 y.o. female scented to the hospital with abdominal pain.  Patient with reported history of Crohn's disease but colonoscopy in 2015 showed normal colon as well as normal TI without any evidence of inflammation.  Dr. Wynetta Emery had recommended repeat colonoscopy at normal screening age at that time.  On initial evaluation, she was found to have mild elevated LFTs with T. bili of 2.2 alk phos 132, AST 317 and ALT 174.  Mild elevated lipase at around 50.  Normal CBC with some hemoconcentration.  Ultrasound right upper quadrant showed gallstones with possible 7 mm CBD stone.  Follow-up MRI MRCP confirmed 2 tiny CBD stone measuring 3 and 5 mm with CBD of 6 mm.  No evidence of pancreatitis.  GI is consulted for further evaluation.    Past Medical History:  Diagnosis Date   Allergy    Anemia    Crohn's disease ([place])    GERD (gastroesophageal reflux disease)    Headache(784.0)    MIGRAINE AND TENSION   Joint pain    DISINTRIGATING SI JOINT    Past Surgical History:  Procedure Laterality Date   CESAREAN SECTION  2010   COLONOSCOPY WITH PROPOFOL N/A 05/24/2014   Procedure: COLONOSCOPY WITH PROPOFOL;  Surgeon: Garlan Fair, MD;  Location: [facility];  Service: Endoscopy;  Laterality: N/A;   SACROILIAC JOINT FUSION Right 08/01/2016   Procedure: RIGHT SIDED SACROILIAC JOINT FUSION;  Surgeon: Phylliss Bob, MD;  Location: [place];  Service: Orthopedics;  Laterality: Right;  RIGHT SIDED SACROILIAC JOINT FUSION    Prior to Admission medications   Medication Sig Start Date End Date Taking? Authorizing Provider  acetaminophen (TYLENOL) 650 MG CR tablet Take 1,300 mg by mouth 2 (two) times daily as needed for pain.   Yes [provider]  hydrOXYzine (VISTARIL) 25 MG capsule Take 50 mg  by mouth at bedtime as needed (sleep).   Yes [provider]  levonorgestrel (MIRENA, 52 MG,) 20 MCG/DAY IUD 1 each by Intrauterine route continuous. 11/27/20  Yes [provider]  meloxicam (MOBIC) 15 MG tablet Take 15 mg by mouth as needed for pain. 02/02/23  Yes [provider]  pantoprazole (PROTONIX) 40 MG tablet Take 40 mg by mouth 2 (two) times daily as needed (reflux).   Yes [provider]  SUMAtriptan (IMITREX) 50 MG tablet Take 50 mg by mouth every 2 (two) hours as needed for migraine or headache.   Yes [provider]    Scheduled Meds:  pantoprazole (PROTONIX) IV  40 mg Intravenous Q24H   Continuous Infusions:  0.9 % NaCl with KCl 20 mEq / L     lactated ringers 125 mL/hr at 02/27/23 1012   PRN Meds:.albuterol, hydrALAZINE, hydrOXYzine, morphine injection, ondansetron **OR** ondansetron (ZOFRAN) IV, SUMAtriptan, traZODone  Allergies as of 02/27/2023 - Review Complete 02/27/2023  Allergen Reaction Noted   Cortisone Other (See Comments) 04/30/2018   Tramadol Itching 02/27/2023   Hydrocodone Rash 02/27/2023   Iron Other (See Comments) 05/20/2014   Oxycodone Rash 02/27/2023   Sulfa antibiotics Itching 07/05/2013    Family History  Problem Relation Age of Onset   Colitis Mother    Stroke Maternal Grandfather    Alzheimer's disease Paternal Grandmother    Emphysema Paternal Grandfather     Social History   Socioeconomic History   Marital status:  Single    Spouse name: Not on file   Number of children: 1   Years of education: Not on file   Highest education level: Not on file  Occupational History   Occupation: Scientist, clinical (histocompatibility and immunogenetics)    Comment: [employer]  Tobacco Use   Smoking status: Never   Smokeless tobacco: Never  Substance and Sexual Activity   Alcohol use: No   Drug use: No   Sexual activity: Yes    Birth control/protection: None  Other Topics Concern   Not on file  Social History Narrative   Lives with Harle Battiest, her boyfriend, and her daughter.   Social Determinants of Health   Financial Resource Strain: Not on file  Food Insecurity: Not on file  Transportation Needs: Not on file  Physical Activity: Not on file  Stress: Not on file  Social Connections: Not on file  Intimate Partner Violence: Not on file    Review of Systems: All negative except as stated above in HPI.  Physical Exam: Vital signs: Vitals:   02/27/23 0807 02/27/23 1000  BP:  109/68  Pulse:  (!) 109  Resp: 16 16  Temp: 98.7 F (37.1 C)   SpO2:  100%     General:   Alert,  Well-developed, well-nourished, pleasant and cooperative in NAD Lungs:  Clear throughout to auscultation.   No wheezes, crackles, or rhonchi. No acute distress. Heart:  Regular rate and rhythm; no murmurs, clicks, rubs,  or gallops. Abdomen: Right upper quadrant and epigastric tenderness to palpation, abdomen is otherwise soft, bowel sounds present, no peritoneal signs Rectal:  Deferred  GI:  Lab Results: Recent Labs    02/27/23 0136  WBC 7.4  HGB 14.9  HCT 44.8  PLT 231   BMET Recent Labs    02/27/23 0136  NA 136  K 3.8  CL 108  CO2 22  GLUCOSE 95  BUN 8  CREATININE 1.13*  CALCIUM 8.5*   LFT Recent Labs    02/27/23 0136  PROT 6.7  ALBUMIN 3.8  AST 317*  ALT 174*  ALKPHOS 132*  BILITOT 2.2*   PT/INR No results for input(s): "LABPROT", "INR" in the last 72 hours.   Studies/Results: MR ABDOMEN MRCP W WO CONTAST  Result Date: 02/27/2023 CLINICAL DATA:  Evaluate for bile duct obstruction. EXAM: MRI ABDOMEN WITHOUT AND WITH CONTRAST (INCLUDING MRCP) TECHNIQUE: Multiplanar multisequence MR imaging of the abdomen was performed both before and after the administration of intravenous contrast. Heavily T2-weighted images of the biliary and pancreatic ducts were obtained, and three-dimensional MRCP images were rendered by post processing. CONTRAST:  29m GADAVIST GADOBUTROL 1 MMOL/ML IV SOLN COMPARISON:  Gallbladder  sonogram from earlier today FINDINGS: Lower chest: No acute findings. Hepatobiliary: Mild hepatic steatosis. No suspicious enhancing liver lesions. Multiple small stones are identified layering within the gallbladder measuring up to 4 mm. Upper limits of normal common bile duct measuring 6 mm. Filling defect within the distal CBD compatible with choledocholithiasis this measures 5 mm, image 50/9. Slightly more distal is a second tiny stone just before the ampulla measuring 3 mm, image 51/9. Pancreas: No mass, inflammatory changes, or other parenchymal abnormality identified. Spleen:  Within normal limits in size and appearance. Adrenals/Urinary Tract: No masses identified. No evidence of hydronephrosis. Stomach/Bowel: Visualized portions within the abdomen are unremarkable. Vascular/Lymphatic: No pathologically enlarged lymph nodes identified. No abdominal aortic aneurysm demonstrated. Other:  There is no ascites or focal fluid collections. Musculoskeletal: No suspicious bone lesions identified. IMPRESSION: 1. Gallstones and  choledocholithiasis. Two stones within the distal common bile duct measure up to 5 mm. 2. No gallbladder wall thickening or pericholecystic fluid noted. 3. Mild hepatic steatosis. Electronically Signed   By: Kerby Moors M.D.   On: 02/27/2023 09:22   MR 3D Recon At Scanner  Result Date: 02/27/2023 CLINICAL DATA:  Evaluate for bile duct obstruction. EXAM: MRI ABDOMEN WITHOUT AND WITH CONTRAST (INCLUDING MRCP) TECHNIQUE: Multiplanar multisequence MR imaging of the abdomen was performed both before and after the administration of intravenous contrast. Heavily T2-weighted images of the biliary and pancreatic ducts were obtained, and three-dimensional MRCP images were rendered by post processing. CONTRAST:  80m GADAVIST GADOBUTROL 1 MMOL/ML IV SOLN COMPARISON:  Gallbladder sonogram from earlier today FINDINGS: Lower chest: No acute findings. Hepatobiliary: Mild hepatic steatosis. No suspicious  enhancing liver lesions. Multiple small stones are identified layering within the gallbladder measuring up to 4 mm. Upper limits of normal common bile duct measuring 6 mm. Filling defect within the distal CBD compatible with choledocholithiasis this measures 5 mm, image 50/9. Slightly more distal is a second tiny stone just before the ampulla measuring 3 mm, image 51/9. Pancreas: No mass, inflammatory changes, or other parenchymal abnormality identified. Spleen:  Within normal limits in size and appearance. Adrenals/Urinary Tract: No masses identified. No evidence of hydronephrosis. Stomach/Bowel: Visualized portions within the abdomen are unremarkable. Vascular/Lymphatic: No pathologically enlarged lymph nodes identified. No abdominal aortic aneurysm demonstrated. Other:  There is no ascites or focal fluid collections. Musculoskeletal: No suspicious bone lesions identified. IMPRESSION: 1. Gallstones and choledocholithiasis. Two stones within the distal common bile duct measure up to 5 mm. 2. No gallbladder wall thickening or pericholecystic fluid noted. 3. Mild hepatic steatosis. Electronically Signed   By: TKerby MoorsM.D.   On: 02/27/2023 09:22   UKoreaAbdomen Limited RUQ (LIVER/GB)  Result Date: 02/27/2023 CLINICAL DATA:  Right upper quadrant pain EXAM: ULTRASOUND ABDOMEN LIMITED RIGHT UPPER QUADRANT COMPARISON:  None Available. FINDINGS: Gallbladder: Multiple stones are identified within the gallbladder. These measure up to 6 mm in diameter. Gallbladder wall is upper limits of normal at 3 mm. No pericholecystic fluid or sonographic Murphy's sign. Common bile duct: Diameter: 7 mm. Filling defects within the CBD are noted concerning for choledocholithiasis. Liver: No focal lesion identified. Within normal limits in parenchymal echogenicity. Portal vein is patent on color Doppler imaging with normal direction of blood flow towards the liver. Other: None. IMPRESSION: 1. Gallstones. Gallbladder wall thickness is  upper limits of normal at 3 mm. No pericholecystic fluid or sonographic Murphy's sign. 2. Common bile duct is dilated to 7 mm. Filling defects are noted within the CBD concerning for choledocholithiasis. MRI/MRCP may be helpful to confirm. Electronically Signed   By: TKerby MoorsM.D.   On: 02/27/2023 06:26   DG Chest 2 View  Result Date: 02/27/2023 CLINICAL DATA:  Shortness of breath and chest pain EXAM: CHEST - 2 VIEW COMPARISON:  07/25/2016 FINDINGS: The heart size and mediastinal contours are within normal limits. Both lungs are clear. The visualized skeletal structures are unremarkable. IMPRESSION: No active cardiopulmonary disease. Electronically Signed   By: TPlacido SouM.D.   On: 02/27/2023 01:53    Impression/Plan: -Right upper quadrant and epigastric abdominal pain.  Ultrasound and MRI confirming choledocholithiasis as well as multiple gallstones.  Recommendations -------------------------- -Plan for ERCP today.  Case discussed with Dr. HBenson Norway  Risks (post ERCP pancreatitis, bleeding, infection, bowel perforation that could require surgery, sedation-related changes in cardiopulmonary systems), benefits (identification and possible treatment  of source of symptoms, exclusion of certain causes of symptoms), and alternatives (watchful waiting, radiographic imaging studies, empiric medical treatment)  were explained to patient/family in detail and patient wishes to proceed.    LOS: 0 days   Otis Brace  MD, [place] 02/27/2023, 11:02 AM  Contact #  [phone]

## 2023-02-27 NOTE — ED Provider Notes (Signed)
[facility] DEPT Provider Note: Georgena Spurling, MD, FACEP  CSN: [id number] MRN: [id number] ARRIVAL: 02/27/23 at [place]: [id number]   CHIEF COMPLAINT  Abdominal Pain   HISTORY OF PRESENT ILLNESS  02/27/23 5:01 AM Amber Fox is a 47 y.o. female with episodic epigastric and right upper quadrant pain radiating through to her back for the past 3 days.  It occurs after eating can last several hours.  She rates the pain as a 10 out of 10 at its worst but currently she is without pain.  It has been associated with nausea and vomiting.  When the pain is present it is worse with movement or palpation.   Past Medical History:  Diagnosis Date   Allergy    Anemia    Crohn's disease ([place])    GERD (gastroesophageal reflux disease)    Headache(784.0)    MIGRAINE AND TENSION   Joint pain    DISINTRIGATING SI JOINT    Past Surgical History:  Procedure Laterality Date   CESAREAN SECTION  2010   COLONOSCOPY WITH PROPOFOL N/A 05/24/2014   Procedure: COLONOSCOPY WITH PROPOFOL;  Surgeon: Garlan Fair, MD;  Location: [facility];  Service: Endoscopy;  Laterality: N/A;   SACROILIAC JOINT FUSION Right 08/01/2016   Procedure: RIGHT SIDED SACROILIAC JOINT FUSION;  Surgeon: Phylliss Bob, MD;  Location: [place];  Service: Orthopedics;  Laterality: Right;  RIGHT SIDED SACROILIAC JOINT FUSION    Family History  Problem Relation Age of Onset   Colitis Mother    Stroke Maternal Grandfather    Alzheimer's disease Paternal Grandmother    Emphysema Paternal Grandfather     Social History   Tobacco Use   Smoking status: Never   Smokeless tobacco: Never  Substance Use Topics   Alcohol use: No   Drug use: No    Prior to Admission medications   Medication Sig Start Date End Date Taking? Authorizing Provider  acetaminophen (TYLENOL) 500 MG tablet Take 1,000 mg by mouth every 6 (six) hours as needed for mild pain or moderate pain.    [provider]  amoxicillin-clavulanate  (AUGMENTIN) 875-125 MG tablet Take 1 tablet by mouth every 12 (twelve) hours. 04/30/18   Caccavale, Sophia, PA-C  oxyCODONE-acetaminophen (PERCOCET/ROXICET) 5-325 MG tablet Take 1 tablet by mouth every 6 (six) hours as needed for severe pain. 07/03/22   Lacretia Leigh, MD    Allergies Cortisone, Hydrocodone, Iron, and Sulfa antibiotics   REVIEW OF SYSTEMS  Negative except as noted here or in the History of Present Illness.   PHYSICAL EXAMINATION  Initial Vital Signs Blood pressure 107/67, pulse (!) 59, temperature 98.7 F (37.1 C), temperature source Oral, resp. rate 16, height '5\' 4"'$  (1.626 m), weight 59 kg, SpO2 100 %.  Examination General: Well-developed, well-nourished female in no acute distress; appearance consistent with age of record HENT: normocephalic; atraumatic Eyes: pupils equal, round and reactive to light; extraocular muscles intact Neck: supple Heart: regular rate and rhythm Lungs: clear to auscultation bilaterally Abdomen: soft; nondistended; mild epigastric tenderness; bowel sounds present Extremities: No deformity; full range of motion; pulses normal Neurologic: Awake, alert and oriented; motor function intact in all extremities and symmetric; no facial droop Skin: Warm and dry Psychiatric: Normal mood and affect   RESULTS  Summary of this visit's results, reviewed and interpreted by myself:   EKG Interpretation  Date/Time:    Ventricular Rate:    PR Interval:    QRS Duration:   QT Interval:    QTC Calculation:  R Axis:     Text Interpretation:         Laboratory Studies: Results for orders placed or performed during the hospital encounter of 02/27/23 (from the past 24 hour(s))  Lipase, blood     Status: Abnormal   Collection Time: 02/27/23  1:36 AM  Result Value Ref Range   Lipase 52 (H) 11 - 51 U/L  Comprehensive metabolic panel     Status: Abnormal   Collection Time: 02/27/23  1:36 AM  Result Value Ref Range   Sodium 136 135 - 145 mmol/L    Potassium 3.8 3.5 - 5.1 mmol/L   Chloride 108 98 - 111 mmol/L   CO2 22 22 - 32 mmol/L   Glucose, Bld 95 70 - 99 mg/dL   BUN 8 6 - 20 mg/dL   Creatinine, Ser 1.13 (H) 0.44 - 1.00 mg/dL   Calcium 8.5 (L) 8.9 - 10.3 mg/dL   Total Protein 6.7 6.5 - 8.1 g/dL   Albumin 3.8 3.5 - 5.0 g/dL   AST 317 (H) 15 - 41 U/L   ALT 174 (H) 0 - 44 U/L   Alkaline Phosphatase 132 (H) 38 - 126 U/L   Total Bilirubin 2.2 (H) 0.3 - 1.2 mg/dL   GFR, Estimated >60 >60 mL/min   Anion gap 6 5 - 15  CBC with Differential     Status: Abnormal   Collection Time: 02/27/23  1:36 AM  Result Value Ref Range   WBC 7.4 4.0 - 10.5 K/uL   RBC 5.18 (H) 3.87 - 5.11 MIL/uL   Hemoglobin 14.9 12.0 - 15.0 g/dL   HCT 44.8 36.0 - 46.0 %   MCV 86.5 80.0 - 100.0 fL   MCH 28.8 26.0 - 34.0 pg   MCHC 33.3 30.0 - 36.0 g/dL   RDW 12.9 11.5 - 15.5 %   Platelets 231 150 - 400 K/uL   nRBC 0.0 0.0 - 0.2 %   Neutrophils Relative % 67 %   Neutro Abs 5.0 1.7 - 7.7 K/uL   Lymphocytes Relative 23 %   Lymphs Abs 1.7 0.7 - 4.0 K/uL   Monocytes Relative 7 %   Monocytes Absolute 0.5 0.1 - 1.0 K/uL   Eosinophils Relative 2 %   Eosinophils Absolute 0.1 0.0 - 0.5 K/uL   Basophils Relative 1 %   Basophils Absolute 0.1 0.0 - 0.1 K/uL   Immature Granulocytes 0 %   Abs Immature Granulocytes 0.03 0.00 - 0.07 K/uL  Urinalysis, Routine w reflex microscopic -Urine, Clean Catch     Status: Abnormal   Collection Time: 02/27/23  3:36 AM  Result Value Ref Range   Color, Urine AMBER (A) YELLOW   APPearance CLEAR CLEAR   Specific Gravity, Urine 1.018 1.005 - 1.030   pH 5.0 5.0 - 8.0   Glucose, UA NEGATIVE NEGATIVE mg/dL   Hgb urine dipstick NEGATIVE NEGATIVE   Bilirubin Urine NEGATIVE NEGATIVE   Ketones, ur NEGATIVE NEGATIVE mg/dL   Protein, ur NEGATIVE NEGATIVE mg/dL   Nitrite NEGATIVE NEGATIVE   Leukocytes,Ua NEGATIVE NEGATIVE  Pregnancy, urine     Status: None   Collection Time: 02/27/23  4:47 AM  Result Value Ref Range   Preg Test, Ur  NEGATIVE NEGATIVE   Imaging Studies: US Abdomen Limited RUQ (LIVER/GB)  Result Date: 02/27/2023 CLINICAL DATA:  Right upper quadrant pain EXAM: ULTRASOUND ABDOMEN LIMITED RIGHT UPPER QUADRANT COMPARISON:  None Available. FINDINGS: Gallbladder: Multiple stones are identified within the gallbladder. These measure up to 6 mm  in diameter. Gallbladder wall is upper limits of normal at 3 mm. No pericholecystic fluid or sonographic Murphy's sign. Common bile duct: Diameter: 7 mm. Filling defects within the CBD are noted concerning for choledocholithiasis. Liver: No focal lesion identified. Within normal limits in parenchymal echogenicity. Portal vein is patent on color Doppler imaging with normal direction of blood flow towards the liver. Other: None. IMPRESSION: 1. Gallstones. Gallbladder wall thickness is upper limits of normal at 3 mm. No pericholecystic fluid or sonographic Murphy's sign. 2. Common bile duct is dilated to 7 mm. Filling defects are noted within the CBD concerning for choledocholithiasis. MRI/MRCP may be helpful to confirm. Electronically Signed   By: Kerby Moors M.D.   On: 02/27/2023 06:26   DG Chest 2 View  Result Date: 02/27/2023 CLINICAL DATA:  Shortness of breath and chest pain EXAM: CHEST - 2 VIEW COMPARISON:  07/25/2016 FINDINGS: The heart size and mediastinal contours are within normal limits. Both lungs are clear. The visualized skeletal structures are unremarkable. IMPRESSION: No active cardiopulmonary disease. Electronically Signed   By: Placido Sou M.D.   On: 02/27/2023 01:53    ED COURSE and MDM  Nursing notes, initial and subsequent vitals signs, including pulse oximetry, reviewed and interpreted by myself.  Vitals:   02/27/23 0451 02/27/23 0530 02/27/23 0600 02/27/23 0715  BP: 107/67 111/70 100/63 116/80  Pulse: (!) 59 (!) 58 (!) 55 61  Resp: '16 16 16 17  '$ Temp:    98.9 F (37.2 C)  TempSrc:    Oral  SpO2: 100% 99% 99% 99%  Weight:      Height:        Medications - No data to display  6:45 AM Patient continues to be pain-free.  MRCP is pending. Signed out to Dr. Maryan Rued.  PROCEDURES  Procedures   ED DIAGNOSES     ICD-10-CM   1. Calculus of gallbladder and bile duct with obstruction without cholecystitis  K80.71          Lataja Newland, Jenny Reichmann, MD 02/27/23 [phone]

## 2023-02-27 NOTE — ED Triage Notes (Signed)
Intermittent severe epigastric pains since Monday. Pain worst after eating any food. Pt suspects her gallbladder is going bad.

## 2023-02-28 ENCOUNTER — Encounter (HOSPITAL_COMMUNITY)
Admission: EM | Disposition: A | Discharge status: Home / Self Care | Payer: Self-pay | Source: Home / Self Care | Attending: Internal Medicine

## 2023-02-28 ENCOUNTER — Inpatient Hospital Stay (HOSPITAL_COMMUNITY): Payer: Self-pay | Admitting: Anesthesiology

## 2023-02-28 ENCOUNTER — Encounter (HOSPITAL_COMMUNITY): Payer: Self-pay | Admitting: Internal Medicine

## 2023-02-28 DIAGNOSIS — K8010 Calculus of gallbladder with chronic cholecystitis without obstruction: Secondary | ICD-10-CM

## 2023-02-28 HISTORY — PX: CHOLECYSTECTOMY: SHX55

## 2023-02-28 LAB — SURGICAL PCR SCREEN
MRSA, PCR: NEGATIVE
Staphylococcus aureus: POSITIVE — AB

## 2023-02-28 LAB — HIV ANTIBODY (ROUTINE TESTING W REFLEX): HIV Screen 4th Generation wRfx: NONREACTIVE

## 2023-02-28 SURGERY — LAPAROSCOPIC CHOLECYSTECTOMY WITH INTRAOPERATIVE CHOLANGIOGRAM
Anesthesia: General | Site: Abdomen

## 2023-02-28 MED ORDER — FENTANYL CITRATE (PF) 250 MCG/5ML IJ SOLN
INTRAMUSCULAR | Status: DC | PRN
  Administered 2023-02-28 (×5): 50 ug via INTRAVENOUS

## 2023-02-28 MED ORDER — ACETAMINOPHEN 325 MG PO TABS: 650.0000 mg | ORAL_TABLET | Freq: Four times a day (QID) | ORAL | Status: DC | PRN

## 2023-02-28 MED ORDER — LACTATED RINGERS IR SOLN
Status: DC | PRN
  Administered 2023-02-28: 1000 mL

## 2023-02-28 MED ORDER — CHLORHEXIDINE GLUCONATE CLOTH 2 % EX PADS
6.0000 | MEDICATED_PAD | Freq: Every day | CUTANEOUS | Status: DC
  Administered 2023-02-28: 6 via TOPICAL

## 2023-02-28 MED ORDER — PROPOFOL 10 MG/ML IV BOLUS
INTRAVENOUS | Status: AC
  Filled 2023-02-28: qty 20

## 2023-02-28 MED ORDER — HYDROMORPHONE HCL 1 MG/ML IJ SOLN: 0.2500 mg | INTRAMUSCULAR | Status: DC | PRN

## 2023-02-28 MED ORDER — ACETAMINOPHEN 500 MG PO TABS
1000.0000 mg | ORAL_TABLET | Freq: Once | ORAL | Status: AC
  Administered 2023-02-28: 1000 mg via ORAL
  Filled 2023-02-28: qty 2

## 2023-02-28 MED ORDER — DEXAMETHASONE SODIUM PHOSPHATE 10 MG/ML IJ SOLN
INTRAMUSCULAR | Status: AC
  Filled 2023-02-28: qty 1

## 2023-02-28 MED ORDER — DEXAMETHASONE SODIUM PHOSPHATE 10 MG/ML IJ SOLN
INTRAMUSCULAR | Status: DC | PRN
  Administered 2023-02-28: 10 mg via INTRAVENOUS

## 2023-02-28 MED ORDER — LACTATED RINGERS IV SOLN: INTRAVENOUS | Status: DC

## 2023-02-28 MED ORDER — MIDAZOLAM HCL 5 MG/5ML IJ SOLN
INTRAMUSCULAR | Status: DC | PRN
  Administered 2023-02-28: 2 mg via INTRAVENOUS

## 2023-02-28 MED ORDER — BUPIVACAINE-EPINEPHRINE (PF) 0.25% -1:200000 IJ SOLN
INTRAMUSCULAR | Status: AC
  Filled 2023-02-28: qty 30

## 2023-02-28 MED ORDER — MEPERIDINE HCL 50 MG/ML IJ SOLN: 6.2500 mg | INTRAMUSCULAR | Status: DC | PRN

## 2023-02-28 MED ORDER — LIDOCAINE HCL (PF) 2 % IJ SOLN
INTRAMUSCULAR | Status: AC
  Filled 2023-02-28: qty 5

## 2023-02-28 MED ORDER — 0.9 % SODIUM CHLORIDE (POUR BTL) OPTIME
TOPICAL | Status: DC | PRN
  Administered 2023-02-28: 1000 mL

## 2023-02-28 MED ORDER — FENTANYL CITRATE (PF) 250 MCG/5ML IJ SOLN
INTRAMUSCULAR | Status: AC
  Filled 2023-02-28: qty 5

## 2023-02-28 MED ORDER — ONDANSETRON HCL 4 MG/2ML IJ SOLN
INTRAMUSCULAR | Status: DC | PRN
  Administered 2023-02-28: 4 mg via INTRAVENOUS

## 2023-02-28 MED ORDER — ROCURONIUM BROMIDE 10 MG/ML (PF) SYRINGE
PREFILLED_SYRINGE | INTRAVENOUS | Status: DC | PRN
  Administered 2023-02-28: 40 mg via INTRAVENOUS

## 2023-02-28 MED ORDER — CIPROFLOXACIN IN D5W 400 MG/200ML IV SOLN
INTRAVENOUS | Status: AC
  Filled 2023-02-28: qty 200

## 2023-02-28 MED ORDER — CHLORHEXIDINE GLUCONATE 0.12 % MT SOLN
15.0000 mL | Freq: Once | OROMUCOSAL | Status: AC
  Administered 2023-02-28: 15 mL via OROMUCOSAL

## 2023-02-28 MED ORDER — ONDANSETRON HCL 4 MG/2ML IJ SOLN
INTRAMUSCULAR | Status: AC
  Filled 2023-02-28: qty 2

## 2023-02-28 MED ORDER — SUGAMMADEX SODIUM 200 MG/2ML IV SOLN
INTRAVENOUS | Status: DC | PRN
  Administered 2023-02-28: 175 mg via INTRAVENOUS

## 2023-02-28 MED ORDER — MUPIROCIN 2 % EX OINT
1.0000 | TOPICAL_OINTMENT | Freq: Two times a day (BID) | CUTANEOUS | Status: DC
  Administered 2023-02-28 (×2): 1 via NASAL
  Filled 2023-02-28: qty 22

## 2023-02-28 MED ORDER — CIPROFLOXACIN IN D5W 400 MG/200ML IV SOLN
INTRAVENOUS | Status: DC | PRN
  Administered 2023-02-28: 400 mg via INTRAVENOUS

## 2023-02-28 MED ORDER — MIDAZOLAM HCL 2 MG/2ML IJ SOLN
INTRAMUSCULAR | Status: AC
  Filled 2023-02-28: qty 2

## 2023-02-28 MED ORDER — TRAMADOL HCL 50 MG PO TABS: 50.0000 mg | ORAL_TABLET | Freq: Four times a day (QID) | ORAL | Status: DC | PRN

## 2023-02-28 MED ORDER — POTASSIUM CHLORIDE IN NACL 20-0.9 MEQ/L-% IV SOLN
INTRAVENOUS | Status: DC
  Filled 2023-02-28 (×2): qty 1000

## 2023-02-28 MED ORDER — PROPOFOL 10 MG/ML IV BOLUS
INTRAVENOUS | Status: DC | PRN
  Administered 2023-02-28: 120 mg via INTRAVENOUS

## 2023-02-28 MED ORDER — LIDOCAINE 2% (20 MG/ML) 5 ML SYRINGE
INTRAMUSCULAR | Status: DC | PRN
  Administered 2023-02-28: 100 mg via INTRAVENOUS

## 2023-02-28 MED ORDER — PROMETHAZINE HCL 25 MG/ML IJ SOLN: 6.2500 mg | INTRAMUSCULAR | Status: DC | PRN

## 2023-02-28 MED ORDER — SCOPOLAMINE 1 MG/3DAYS TD PT72
1.0000 | MEDICATED_PATCH | Freq: Once | TRANSDERMAL | Status: DC
  Administered 2023-02-28: 1.5 mg via TRANSDERMAL
  Filled 2023-02-28: qty 1

## 2023-02-28 SURGICAL SUPPLY — 42 items
ADH SKN CLS APL DERMABOND .7 (GAUZE/BANDAGES/DRESSINGS)
APL PRP STRL LF DISP 70% ISPRP (MISCELLANEOUS) ×2
APPLIER CLIP ROT 10 11.4 M/L (STAPLE) ×1
APR CLP MED LRG 11.4X10 (STAPLE) ×1
BAG COUNTER SPONGE SURGICOUNT (BAG) IMPLANT
BAG SPNG CNTER NS LX DISP (BAG)
CABLE HIGH FREQUENCY MONO STRZ (ELECTRODE) ×2 IMPLANT
CHLORAPREP W/TINT 26 (MISCELLANEOUS) ×4 IMPLANT
CLIP APPLIE ROT 10 11.4 M/L (STAPLE) ×2 IMPLANT
COVER MAYO STAND XLG (MISCELLANEOUS) ×2 IMPLANT
COVER SURGICAL LIGHT HANDLE (MISCELLANEOUS) ×2 IMPLANT
DERMABOND ADVANCED .7 DNX12 (GAUZE/BANDAGES/DRESSINGS) IMPLANT
DRAPE C-ARM 42X120 X-RAY (DRAPES) ×2 IMPLANT
ELECT REM PT RETURN 15FT ADLT (MISCELLANEOUS) ×2 IMPLANT
GAUZE SPONGE 2X2 8PLY STRL LF (GAUZE/BANDAGES/DRESSINGS) ×2 IMPLANT
GLOVE SURG ORTHO 8.0 STRL STRW (GLOVE) ×2 IMPLANT
GLOVE SURG SYN 7.5  E (GLOVE) ×2
GLOVE SURG SYN 7.5 E (GLOVE) ×2 IMPLANT
GLOVE SURG SYN 7.5 PF PI (GLOVE) ×4 IMPLANT
GOWN STRL REUS W/ TWL XL LVL3 (GOWN DISPOSABLE) ×4 IMPLANT
GOWN STRL REUS W/TWL XL LVL3 (GOWN DISPOSABLE) ×2
HEMOSTAT SURGICEL 4X8 (HEMOSTASIS) IMPLANT
IRRIG SUCT STRYKERFLOW 2 WTIP (MISCELLANEOUS) ×1
IRRIGATION SUCT STRKRFLW 2 WTP (MISCELLANEOUS) ×2 IMPLANT
KIT BASIN OR (CUSTOM PROCEDURE TRAY) ×2 IMPLANT
KIT TURNOVER KIT A (KITS) IMPLANT
PENCIL SMOKE EVACUATOR (MISCELLANEOUS) IMPLANT
SCISSORS LAP 5X35 DISP (ENDOMECHANICALS) ×2 IMPLANT
SET CHOLANGIOGRAPH MIX (MISCELLANEOUS) ×2 IMPLANT
SET TUBE SMOKE EVAC HIGH FLOW (TUBING) IMPLANT
SLEEVE Z-THREAD 5X100MM (TROCAR) ×2 IMPLANT
SPIKE FLUID TRANSFER (MISCELLANEOUS) ×2 IMPLANT
STRIP CLOSURE SKIN 1/2X4 (GAUZE/BANDAGES/DRESSINGS) IMPLANT
SUT MNCRL AB 4-0 PS2 18 (SUTURE) ×2 IMPLANT
SYS BAG RETRIEVAL 10MM (BASKET) ×1
SYSTEM BAG RETRIEVAL 10MM (BASKET) ×2 IMPLANT
TOWEL OR 17X26 10 PK STRL BLUE (TOWEL DISPOSABLE) ×2 IMPLANT
TOWEL OR NON WOVEN STRL DISP B (DISPOSABLE) ×2 IMPLANT
TRAY LAPAROSCOPIC (CUSTOM PROCEDURE TRAY) ×2 IMPLANT
TROCAR 11X100 Z THREAD (TROCAR) ×2 IMPLANT
TROCAR BALLN 12MMX100 BLUNT (TROCAR) ×2 IMPLANT
TROCAR Z-THREAD OPTICAL 5X100M (TROCAR) ×2 IMPLANT

## 2023-02-28 NOTE — Progress Notes (Signed)
[facility] Gastroenterology Progress Note  TAISIYA Fox 47 y.o. 1976/04/06  CC:   Choledocholithiasis   Subjective: Patient seen and examined at bedside.  Underwent ERCP with stone extraction yesterday.  She is asymptomatic today.  ROS : Afebrile, negative for chest pain   Objective: Vital signs in last 24 hours: Vitals:   02/27/23 2117 02/28/23 0450  BP: 121/68 102/61  Pulse: 66 (!) 51  Resp: 18 18  Temp: 97.9 F (36.6 C) (!) 97.5 F (36.4 C)  SpO2: 98% 98%    Physical Exam:  General:  Alert, cooperative, no distress, appears stated age  Head:  Normocephalic, without obvious abnormality, atraumatic  Eyes:  , EOM's intact,   Lungs:   Clear to auscultation bilaterally, respirations unlabored  Heart:  Regular rate and rhythm, S1, S2 normal  Abdomen:   Soft, non-tender, bowel sounds active all four quadrants,  no masses,           Lab Results: Recent Labs    02/27/23 0136  NA 136  K 3.8  CL 108  CO2 22  GLUCOSE 95  BUN 8  CREATININE 1.13*  CALCIUM 8.5*   Recent Labs    02/27/23 0136  AST 317*  ALT 174*  ALKPHOS 132*  BILITOT 2.2*  PROT 6.7  ALBUMIN 3.8   Recent Labs    02/27/23 0136  WBC 7.4  NEUTROABS 5.0  HGB 14.9  HCT 44.8  MCV 86.5  PLT 231   No results for input(s): "LABPROT", "INR" in the last 72 hours.    Assessment/Plan: Choledocholithiasis -s/p ERCP with sphincterotomy and stone extraction yesterday.   -abormal LFTs.  Likely from above.  Recommendations ----------------------------- -Repeat LFTs in the morning.  No further inpatient GI workup planned.  She is going for cholecystectomy today.  GI will sign off.  Call us back if needed.   Otis Brace MD, FACP 02/28/2023, 10:59 AM  Contact #  [phone]

## 2023-02-28 NOTE — Transfer of Care (Signed)
Immediate Anesthesia Transfer of Care Note  Patient: ARDELL FARNWORTH  Procedure(s) Performed: LAPAROSCOPIC CHOLECYSTECTOMY (Abdomen)  Patient Location: PACU  Anesthesia Type:General  Level of Consciousness: awake, oriented, and patient cooperative  Airway & Oxygen Therapy: Patient Spontanous Breathing and Patient connected to nasal cannula oxygen  Post-op Assessment: Report given to RN and Post -op Vital signs reviewed and stable  Post vital signs: Reviewed  Last Vitals:  Vitals Value Taken Time  BP 115/74 02/28/23 1527  Temp    Pulse 77 02/28/23 1530  Resp    SpO2 100 % 02/28/23 1530  Vitals shown include unvalidated device data.  Last Pain:  Vitals:   02/28/23 1224  TempSrc:   PainSc: 0-No pain      Patients Stated Pain Goal: 0 ([id number])  Complications: No notable events documented.

## 2023-02-28 NOTE — Anesthesia Postprocedure Evaluation (Signed)
Anesthesia Post Note  Patient: Amber Fox  Procedure(s) Performed: LAPAROSCOPIC CHOLECYSTECTOMY (Abdomen)     Patient location during evaluation: PACU Anesthesia Type: General Level of consciousness: awake and alert Pain management: pain level controlled Vital Signs Assessment: post-procedure vital signs reviewed and stable Respiratory status: spontaneous breathing, nonlabored ventilation and respiratory function stable Cardiovascular status: blood pressure returned to baseline Postop Assessment: no apparent nausea or vomiting Anesthetic complications: no   No notable events documented.  Last Vitals:  Vitals:   02/28/23 0450 02/28/23 1216  BP: 102/61 111/68  Pulse: (!) 51 60  Resp: 18 15  Temp: (!) 36.4 C 36.6 C  SpO2: 98% 97%    Last Pain:  Vitals:   02/28/23 1224  TempSrc:   PainSc: 0-No pain                 Marthenia Rolling

## 2023-02-28 NOTE — Assessment & Plan Note (Signed)
-   Patient underwent RUQ U/S and MRCP.  Findings consistent with choledocholithiasis -Patient had abdominal pain with associated nausea and vomiting.  No fevers, chills, lethargy - LFTs elevated consistent with obstructive pattern on admission -Patient underwent ERCP on 02/27/2023 -Underwent cholecystectomy on 02/28/2023 and tolerated well - LFTs showed signs of improvement on 03/01/2023 when checked prior to discharge

## 2023-02-28 NOTE — Progress Notes (Addendum)
Progress Note    Amber Fox   ZOX:[id number]  DOB: [birth year]  DOA: 02/27/2023     1 PCP: Amber Ferretti, DO  Initial CC: abdominal pain  Hospital Course: Amber Fox is a 47 yo female with PMH GERD, anemia who presented with 3 to 4-day complaints of right-sided abdominal pain.  She was having associated nausea and some vomiting on Monday.  She had no prior symptoms of similar in the past.  She denied any lethargy, fevers, chills. Multiple imaging studies were performed including right upper quadrant ultrasound followed by MRCP.  She was ultimately found to have cholelithiasis and choledocholithiasis with 2 CBD stones noted.  There was no gallbladder wall thickening or pericholecystic fluid noted. She underwent evaluations by GI and general surgery. ERCP was performed on 02/27/2023 with stone removal.  She then underwent evaluation with general surgery for plans for cholecystectomy on 02/28/2023.  Interval History:  Patient seen this morning with mom present bedside.  Abdominal pain had significantly improved and she was feeling better.  She was planning on undergoing cholecystectomy later today.  Assessment and Plan: * Choledocholithiasis - Patient underwent RUQ U/S and MRCP.  Findings consistent with choledocholithiasis -Patient had abdominal pain with associated nausea and vomiting.  No fevers, chills, lethargy - LFTs elevated consistent with obstructive pattern on admission -Patient underwent ERCP on 02/27/2023 - Tentative plans for cholecystectomy today, follow-up results  GERD (gastroesophageal reflux disease) - continue PPI  Crohn's disease (HCC) - Possible Crohn's however her last colonoscopy June 2015 was negative for signs of IBD -Unclear significance or validity of this problem on PMH   Old records reviewed in assessment of this patient  Antimicrobials:   DVT prophylaxis:  SCDs Start: 02/27/23 1054   Code Status:   Code Status: Full Code  Mobility  Assessment (last 72 hours)     Mobility Assessment     Row Name 02/28/23 0945 02/27/23 1600         Does patient have an order for bedrest or is patient medically unstable No - Continue assessment No - Continue assessment      What is the highest level of mobility based on the progressive mobility assessment? Level 6 (Walks independently in room and hall) - Balance while walking in room without assist - Complete Level 6 (Walks independently in room and hall) - Balance while walking in room without assist - Complete               Barriers to discharge: none Disposition Plan:  Home 1-2 days Status is: Inpt  Objective: Blood pressure 111/68, pulse 60, temperature 97.9 F (36.6 C), temperature source Oral, resp. rate 15, height 5\' 4"  (1.626 m), weight 59 kg, SpO2 97 %.  Examination:  Physical Exam Constitutional:      General: She is not in acute distress.    Appearance: She is well-developed.  HENT:     Head: Normocephalic and atraumatic.     Mouth/Throat:     Mouth: Mucous membranes are moist.  Eyes:     Extraocular Movements: Extraocular movements intact.  Cardiovascular:     Rate and Rhythm: Normal rate and regular rhythm.  Pulmonary:     Effort: Pulmonary effort is normal.     Breath sounds: Normal breath sounds.  Abdominal:     General: Bowel sounds are normal. There is no distension.     Palpations: Abdomen is soft.     Tenderness: There is no abdominal tenderness.  Musculoskeletal:  General: Normal range of motion.     Cervical back: Normal range of motion and neck supple.  Skin:    General: Skin is warm and dry.  Neurological:     General: No focal deficit present.     Mental Status: She is alert and oriented to person, place, and time.  Psychiatric:        Mood and Affect: Mood normal.        Behavior: Behavior normal.      Consultants:  General surgery GI  Procedures:  3/14: ERCP with stone stent and sphincterotomy 02/28/2023: Tentative  cholecystectomy  Data Reviewed: Results for orders placed or performed during the hospital encounter of 02/27/23 (from the past 24 hour(s))  Surgical pcr screen     Status: Abnormal   Collection Time: 02/27/23  9:18 PM   Specimen: Nasal Mucosa; Nasal Swab  Result Value Ref Range   MRSA, PCR NEGATIVE NEGATIVE   Staphylococcus aureus POSITIVE (A) NEGATIVE  HIV Antibody (routine testing w rflx)     Status: None   Collection Time: 02/28/23  6:56 AM  Result Value Ref Range   HIV Screen 4th Generation wRfx Non Reactive Non Reactive    I have reviewed pertinent nursing notes, vitals, labs, and images as necessary. I have ordered labwork to follow up on as indicated.  I have reviewed the last notes from staff over past 24 hours. I have discussed patient's care plan and test results with nursing staff, CM/SW, and other staff as appropriate.  Time spent: Greater than 50% of the 55 minute visit was spent in counseling/coordination of care for the patient as laid out in the A&P.   LOS: 1 day   Lewie Chamber, MD [facility] 02/28/2023, 12:19 PM

## 2023-02-28 NOTE — Assessment & Plan Note (Signed)
continue PPI

## 2023-02-28 NOTE — Anesthesia Preprocedure Evaluation (Addendum)
Anesthesia Evaluation  Patient identified by MRN, date of birth, ID band Patient awake    Reviewed: Allergy & Precautions, NPO status , Patient's Chart, lab work & pertinent test results  History of Anesthesia Complications Negative for: history of anesthetic complications  Airway Mallampati: II  TM Distance: >3 FB Neck ROM: Full    Dental no notable dental hx.    Pulmonary neg pulmonary ROS   Pulmonary exam normal        Cardiovascular negative cardio ROS Normal cardiovascular exam     Neuro/Psych  Headaches  negative psych ROS   GI/Hepatic Neg liver ROS,GERD  Medicated,,Crohn's disease   Endo/Other  negative endocrine ROS    Renal/GU negative Renal ROS  negative genitourinary   Musculoskeletal negative musculoskeletal ROS (+)    Abdominal   Peds  Hematology negative hematology ROS (+)   Anesthesia Other Findings Day of surgery medications reviewed with patient.  Reproductive/Obstetrics negative OB ROS                             Anesthesia Physical Anesthesia Plan  ASA: 2  Anesthesia Plan: General   Post-op Pain Management: Toradol IV (intra-op)* and Tylenol PO (pre-op)*   Induction: Intravenous  PONV Risk Score and Plan: 4 or greater and Midazolam, Treatment may vary due to age or medical condition, Scopolamine patch - Pre-op, Propofol infusion, Dexamethasone and Ondansetron  Airway Management Planned: Oral ETT  Additional Equipment: None  Intra-op Plan:   Post-operative Plan: Extubation in OR  Informed Consent: I have reviewed the patients History and Physical, chart, labs and discussed the procedure including the risks, benefits and alternatives for the proposed anesthesia with the patient or authorized representative who has indicated his/her understanding and acceptance.     Dental advisory given  Plan Discussed with: CRNA  Anesthesia Plan Comments:          Anesthesia Quick Evaluation

## 2023-02-28 NOTE — Assessment & Plan Note (Signed)
-   Possible Crohn's however her last colonoscopy June 2015 was negative for signs of IBD -Unclear significance or validity of this problem on PMH

## 2023-02-28 NOTE — Hospital Course (Addendum)
Amber Fox is a 47 yo female with PMH GERD, anemia who presented with 3 to 4-day complaints of right-sided abdominal pain.  She was having associated nausea and some vomiting on Monday.  She had no prior symptoms of similar in the past.  She denied any lethargy, fevers, chills. Multiple imaging studies were performed including right upper quadrant ultrasound followed by MRCP.  She was ultimately found to have cholelithiasis and choledocholithiasis with 2 CBD stones noted.  There was no gallbladder wall thickening or pericholecystic fluid noted. She underwent evaluations by GI and general surgery. ERCP was performed on 02/27/2023 with stone removal.  She then underwent evaluation with general surgery for plans for cholecystectomy on 02/28/2023.

## 2023-02-28 NOTE — Discharge Instructions (Signed)
[facility]  Please arrive at least 30 min before your appointment to complete your check in paperwork.  If you are unable to arrive 30 min prior to your appointment time we may have to cancel or reschedule you. LAPAROSCOPIC SURGERY: POST OP INSTRUCTIONS Always review your discharge instruction sheet given to you by the facility where your surgery was performed. IF YOU HAVE DISABILITY OR FAMILY LEAVE FORMS, YOU MUST BRING THEM TO THE OFFICE FOR PROCESSING.   DO NOT GIVE THEM TO YOUR DOCTOR.  PAIN CONTROL  First take acetaminophen (Tylenol) AND/or ibuprofen (Advil) to control your pain after surgery.  Follow directions on package.  Taking acetaminophen (Tylenol) and/or ibuprofen (Advil) regularly after surgery will help to control your pain and lower the amount of prescription pain medication you may need.  You should not take more than 4,000 mg (4 grams) of acetaminophen (Tylenol) in 24 hours.  You should not take ibuprofen (Advil), aleve, motrin, naprosyn or other NSAIDS if you have a history of stomach ulcers or chronic kidney disease.  A prescription for pain medication may be given to you upon discharge.  Take your pain medication as prescribed, if you still have uncontrolled pain after taking acetaminophen (Tylenol) or ibuprofen (Advil). Use ice packs to help control pain. If you need a refill on your pain medication, please contact your pharmacy.  They will contact our office to request authorization. Prescriptions will not be filled after 5pm or on week-ends.  HOME MEDICATIONS Take your usually prescribed medications unless otherwise directed.  DIET You should follow a light diet the first few days after arrival home.  Be sure to include lots of fluids daily. Avoid fatty, fried foods.   CONSTIPATION It is common to experience some constipation after surgery and if you are taking pain medication.  Increasing fluid intake and taking a stool softener (such as Colace)  will usually help or prevent this problem from occurring.  A mild laxative (Milk of Magnesia or Miralax) should be taken according to package instructions if there are no bowel movements after 48 hours.  WOUND/INCISION CARE Most patients will experience some swelling and bruising in the area of the incisions.  Ice packs will help.  Swelling and bruising can take several days to resolve.  Unless discharge instructions indicate otherwise, follow guidelines below  STERI-STRIPS - you may remove your outer bandages 48 hours after surgery, and you may shower at that time.  You have steri-strips (small skin tapes) in place directly over the incision.  These strips should be left on the skin for 7-10 days.   DERMABOND/SKIN GLUE - you may shower in 24 hours.  The glue will flake off over the next 2-3 weeks. Any sutures or staples will be removed at the office during your follow-up visit.  ACTIVITIES You may resume regular (light) daily activities beginning the next day--such as daily self-care, walking, climbing stairs--gradually increasing activities as tolerated.  You may have sexual intercourse when it is comfortable.  Refrain from any heavy lifting or straining until approved by your doctor. You may drive when you are no longer taking prescription pain medication, you can comfortably wear a seatbelt, and you can safely maneuver your car and apply brakes.  FOLLOW-UP You should see your doctor in the office for a follow-up appointment approximately 2-3 weeks after your surgery.  You should have been given your post-op/follow-up appointment when your surgery was scheduled.  If you did not receive a post-op/follow-up appointment, make sure   that you call for this appointment within a day or two after you arrive home to insure a convenient appointment time.   WHEN TO CALL YOUR DOCTOR: Fever over 101.0 Inability to urinate Continued bleeding from incision. Increased pain, redness, or drainage from the  incision. Increasing abdominal pain  The clinic staff is available to answer your questions during regular business hours.  Please don't hesitate to call and ask to speak to one of the nurses for clinical concerns.  If you have a medical emergency, go to the nearest emergency room or call 911.  A surgeon from [facility] is always on call at the hospital. [address], [address], [place], [place]  [address] ? [address] [address], [place], [place]   [address] [phone] ? [phone] ? FAX [phone]     Managing Your Pain After Surgery Without Opioids    Thank you for participating in our program to help patients manage their pain after surgery without opioids. This is part of our effort to provide you with the best care possible, without exposing you or your family to the risk that opioids pose.  What pain can I expect after surgery? You can expect to have some pain after surgery. This is normal. The pain is typically worse the day after surgery, and quickly begins to get better. Many studies have found that many patients are able to manage their pain after surgery with Over-the-Counter (OTC) medications such as Tylenol and Motrin. If you have a condition that does not allow you to take Tylenol or Motrin, notify your surgical team.  How will I manage my pain? The best strategy for controlling your pain after surgery is around the clock pain control with Tylenol (acetaminophen) and Motrin (ibuprofen or Advil). Alternating these medications with each other allows you to maximize your pain control. In addition to Tylenol and Motrin, you can use heating pads or ice packs on your incisions to help reduce your pain.  How will I alternate your regular strength over-the-counter pain medication? You will take a dose of pain medication every three hours. Start by taking 650 mg of Tylenol (2 pills of 325 mg) 3 hours later take 600 mg of Motrin (3 pills of 200 mg) 3 hours after  taking the Motrin take 650 mg of Tylenol 3 hours after that take 600 mg of Motrin.   - 1 -  See example - if your first dose of Tylenol is at 12:00 PM   12:00 PM Tylenol 650 mg (2 pills of 325 mg)  3:00 PM Motrin 600 mg (3 pills of 200 mg)  6:00 PM Tylenol 650 mg (2 pills of 325 mg)  9:00 PM Motrin 600 mg (3 pills of 200 mg)  Continue alternating every 3 hours   We recommend that you follow this schedule around-the-clock for at least 3 days after surgery, or until you feel that it is no longer needed. Use the table on the last page of this handout to keep track of the medications you are taking. Important: Do not take more than 3000mg of Tylenol or 3200mg of Motrin in a 24-hour period. Do not take ibuprofen/Motrin if you have a history of bleeding stomach ulcers, severe kidney disease, &/or actively taking a blood thinner  What if I still have pain? If you have pain that is not controlled with the over-the-counter pain medications (Tylenol and Motrin or Advil) you might have what we call "breakthrough" pain. You will receive a prescription   for a small amount of an opioid pain medication such as Oxycodone, Tramadol, or Tylenol with Codeine. Use these opioid pills in the first 24 hours after surgery if you have breakthrough pain. Do not take more than 1 pill every 4-6 hours.  If you still have uncontrolled pain after using all opioid pills, don't hesitate to call our staff using the number provided. We will help make sure you are managing your pain in the best way possible, and if necessary, we can provide a prescription for additional pain medication.   Day 1    Time  Name of Medication Number of pills taken  Amount of Acetaminophen  Pain Level   Comments  AM PM       AM PM       AM PM       AM PM       AM PM       AM PM       AM PM       AM PM       Total Daily amount of Acetaminophen Do not take more than  3,000 mg per day      Day 2    Time  Name of Medication  Number of pills taken  Amount of Acetaminophen  Pain Level   Comments  AM PM       AM PM       AM PM       AM PM       AM PM       AM PM       AM PM       AM PM       Total Daily amount of Acetaminophen Do not take more than  3,000 mg per day      Day 3    Time  Name of Medication Number of pills taken  Amount of Acetaminophen  Pain Level   Comments  AM PM       AM PM       AM PM       AM PM         AM PM       AM PM       AM PM       AM PM       Total Daily amount of Acetaminophen Do not take more than  3,000 mg per day      Day 4    Time  Name of Medication Number of pills taken  Amount of Acetaminophen  Pain Level   Comments  AM PM       AM PM       AM PM       AM PM       AM PM       AM PM       AM PM       AM PM       Total Daily amount of Acetaminophen Do not take more than  3,000 mg per day      Day 5    Time  Name of Medication Number of pills taken  Amount of Acetaminophen  Pain Level   Comments  AM PM       AM PM       AM PM       AM PM       AM PM       AM PM         AM PM       AM PM       Total Daily amount of Acetaminophen Do not take more than  3,000 mg per day      Day 6    Time  Name of Medication Number of pills taken  Amount of Acetaminophen  Pain Level  Comments  AM PM       AM PM       AM PM       AM PM       AM PM       AM PM       AM PM       AM PM       Total Daily amount of Acetaminophen Do not take more than  3,000 mg per day      Day 7    Time  Name of Medication Number of pills taken  Amount of Acetaminophen  Pain Level   Comments  AM PM       AM PM       AM PM       AM PM       AM PM       AM PM       AM PM       AM PM       Total Daily amount of Acetaminophen Do not take more than  3,000 mg per day        For additional information about how and where to safely dispose of unused opioid medications - https://www.morepowerfulnc.org  Disclaimer: This document contains  information and/or instructional materials adapted from [facility] for the typical patient with your condition. It does not replace medical advice from your health care provider because your experience may differ from that of the typical patient. Talk to your health care provider if you have any questions about this document, your condition or your treatment plan. Adapted from [facility]  

## 2023-02-28 NOTE — TOC Progression Note (Signed)
Transition of Care [facility]) - Progression Note    Patient Details  Name: Amber Fox MRN: [id number] Date of Birth: 22-Sep-1976  Transition of Care [facility]) CM/SW [place], RN Phone Number:[phone]  02/28/2023, 5:06 PM  Clinical Narrative:     Transition of Care [facility]) Screening Note   Patient Details  Name: Amber Fox Date of Birth: 1976/02/19   Transition of Care [facility]) CM/SW Contact:    Angelita Ingles, RN Phone Number: 02/28/2023, 5:07 PM    Transition of Care Department [facility]) has reviewed patient and no TOC needs have been identified at this time. We will continue to monitor patient advancement through interdisciplinary progression rounds. If new patient transition needs arise, please place a TOC consult.          Expected Discharge Plan and Services                                               Social Determinants of Health (SDOH) Interventions SDOH Screenings   Food Insecurity: No Food Insecurity (02/27/2023)  Housing: Low Risk  (02/27/2023)  Transportation Needs: No Transportation Needs (02/27/2023)  Utilities: Not At Risk (02/27/2023)  Tobacco Use: Low Risk  (02/28/2023)    Readmission Risk Interventions     No data to display

## 2023-02-28 NOTE — Progress Notes (Signed)
Patient ID: Amber Fox, female   DOB: May 04, 1976, 47 y.o.   MRN: [id number]      Patient seen and examined.  Plan to proceed with lap cholecystectomy this morning.  Patient understands procedure and agrees to proceed.  Armandina Gemma, MD [facility] [place] [facility] Office: [phone]

## 2023-02-28 NOTE — Anesthesia Postprocedure Evaluation (Signed)
Anesthesia Post Note  Patient: PHOEBEE AUTREY  Procedure(s) Performed: ENDOSCOPIC RETROGRADE CHOLANGIOPANCREATOGRAPHY (ERCP) SPHINCTEROTOMY REMOVAL OF STONES     Patient location during evaluation: PACU Anesthesia Type: General Level of consciousness: awake and alert Pain management: pain level controlled Vital Signs Assessment: post-procedure vital signs reviewed and stable Respiratory status: spontaneous breathing, nonlabored ventilation and respiratory function stable Cardiovascular status: blood pressure returned to baseline and stable Postop Assessment: no apparent nausea or vomiting Anesthetic complications: no   No notable events documented.  Last Vitals:  Vitals:   02/28/23 1545 02/28/23 1600  BP: 119/76 128/75  Pulse: (!) 58 69  Resp: 14 16  Temp:    SpO2: 100% 100%    Last Pain:  Vitals:   02/28/23 1545  TempSrc:   PainSc: 1    Pain Goal: Patients Stated Pain Goal: 0 (02/27/23 0114)                 Lynda Rainwater

## 2023-02-28 NOTE — Anesthesia Procedure Notes (Signed)
Procedure Name: Intubation Date/Time: 02/28/2023 2:09 PM  Performed by: Jenne Campus, CRNAPre-anesthesia Checklist: Patient identified, Emergency Drugs available, Suction available and Patient being monitored Patient Re-evaluated:Patient Re-evaluated prior to induction Oxygen Delivery Method: Circle System Utilized Preoxygenation: Pre-oxygenation with 100% oxygen Induction Type: IV induction Ventilation: Mask ventilation without difficulty Laryngoscope Size: Miller and 2 Grade View: Grade I Tube type: Oral Tube size: 7.0 mm Number of attempts: 1 Airway Equipment and Method: Stylet and Oral airway Placement Confirmation: ETT inserted through vocal cords under direct vision, positive ETCO2 and breath sounds checked- equal and bilateral Secured at: 21 cm Tube secured with: Tape Dental Injury: Teeth and Oropharynx as per pre-operative assessment

## 2023-02-28 NOTE — Op Note (Signed)
Procedure Note  Pre-operative Diagnosis:  Chronic cholecystitis, cholelithiasis, history of choledocholithiasis  Post-operative Diagnosis:  same  Surgeon:  Armandina Gemma, MD  Assistant:  Barkley Boards, PA-C   Procedure:  Laparoscopic cholecystectomy  Anesthesia:  General  Estimated Blood Loss:  minimal  Drains: none         Specimen: gallbladder to pathology  Indications: Patient is a 47 year old female with chronic cholecystitis, cholelithiasis, and choledocholithiasis.  Patient underwent successful ERCP with stone extraction yesterday.  She now comes to the operating room for cholecystectomy to prevent recurrence.  Procedure description: The patient was seen in the pre-op holding area. The risks, benefits, complications, treatment options, and expected outcomes were previously discussed with the patient. The patient agreed with the proposed plan and has signed the informed consent form.  The patient was transported to operating room #4 at the [facility]. The patient was placed in the supine position on the operating room table. Following induction of general anesthesia, the abdomen was prepped and draped in the usual aseptic fashion.  An incision was made in the skin near the umbilicus. The midline fascia was incised and the peritoneal cavity was entered and a Hasson cannula was introduced under direct vision. The cannula was secured with a 0-Vicryl pursestring suture. Pneumoperitoneum was established with carbon dioxide. Additional cannulae were introduced under direct vision along the right costal margin in the midline, mid-clavicular line, and anterior axillary line.   The gallbladder was identified and the fundus grasped and retracted cephalad.  The gallbladder was enlarged, and moderately edematous.  Peritoneum was incised over the neck of the gallbladder.  The neck of the gallbladder was carefully dissected out down to the junction with the cystic duct.  Vascular  structures were divided with the electrocautery between ligaclips.  3 clips were placed across the proximal cystic duct.  A clip was placed across the neck of the gallbladder and the proximal cystic duct was divided.  Gallbladder was dissected out of the gallbladder bed using the hook electrocautery for hemostasis.  Vascular structures were divided between ligaclips with the electrocautery.  Gallbladder was completely excised from the gallbladder bed and placed into an Endo Catch bag.  The clips on the cystic duct were again visualized and appeared to completely occlude the cystic duct and remained in good position.  The right upper quadrant was irrigated and the gallbladder bed was inspected.  Good hemostasis was noted.  Endo Catch bag was then retrieved through the umbilical port.  It was delivered through the fascia and submitted to pathology for review.  Fascia was closed with the 0 Vicryl pursestring suture.  Cannulae were removed under direct vision and good hemostasis was noted. Pneumoperitoneum was released and the majority of the carbon dioxide evacuated. Local anesthetic was infiltrated at all port sites. Skin incisions were closed with 4-0 Monocril subcuticular sutures and Dermabond was applied.  Instrument, sponge, and needle counts were correct at the conclusion of the case.  The patient was awakened from anesthesia and brought to the recovery room in stable condition.  The patient tolerated the procedure well.   Armandina Gemma, [facility]: [phone]

## 2023-03-01 ENCOUNTER — Encounter (HOSPITAL_COMMUNITY): Payer: Self-pay | Admitting: Surgery

## 2023-03-01 LAB — BASIC METABOLIC PANEL
Anion gap: 7 (ref 5–15)
BUN: 11 mg/dL (ref 6–20)
CO2: 25 mmol/L (ref 22–32)
Calcium: 8.4 mg/dL — ABNORMAL LOW (ref 8.9–10.3)
Chloride: 106 mmol/L (ref 98–111)
Creatinine, Ser: 0.98 mg/dL (ref 0.44–1.00)
GFR, Estimated: >60 mL/min (ref >60)
Glucose, Bld: 110 mg/dL — ABNORMAL HIGH (ref 70–99)
Potassium: 4.4 mmol/L (ref 3.5–5.1)
Sodium: 138 mmol/L (ref 135–145)

## 2023-03-01 LAB — HEPATIC FUNCTION PANEL
ALT: 101 U/L — ABNORMAL HIGH (ref 0–44)
AST: 48 U/L — ABNORMAL HIGH (ref 15–41)
Albumin: 3.3 g/dL — ABNORMAL LOW (ref 3.5–5.0)
Alkaline Phosphatase: 103 U/L (ref 38–126)
Bilirubin, Direct: 0.2 mg/dL (ref 0.0–0.2)
Indirect Bilirubin: 0.7 mg/dL (ref 0.3–0.9)
Total Bilirubin: 0.9 mg/dL (ref 0.3–1.2)
Total Protein: 5.8 g/dL — ABNORMAL LOW (ref 6.5–8.1)

## 2023-03-01 LAB — CBC WITH DIFFERENTIAL/PLATELET
Abs Immature Granulocytes: 0.08 10*3/uL — ABNORMAL HIGH (ref 0.00–0.07)
Basophils Absolute: 0 10*3/uL (ref 0.0–0.1)
Basophils Relative: 0 %
Eosinophils Absolute: 0 10*3/uL (ref 0.0–0.5)
Eosinophils Relative: 0 %
HCT: 39.5 % (ref 36.0–46.0)
Hemoglobin: 12.8 g/dL (ref 12.0–15.0)
Immature Granulocytes: 1 %
Lymphocytes Relative: 9 %
Lymphs Abs: 0.9 10*3/uL (ref 0.7–4.0)
MCH: 28.7 pg (ref 26.0–34.0)
MCHC: 32.4 g/dL (ref 30.0–36.0)
MCV: 88.6 fL (ref 80.0–100.0)
Monocytes Absolute: 0.6 10*3/uL (ref 0.1–1.0)
Monocytes Relative: 6 %
Neutro Abs: 8.8 10*3/uL — ABNORMAL HIGH (ref 1.7–7.7)
Neutrophils Relative %: 84 %
Platelets: 204 10*3/uL (ref 150–400)
RBC: 4.46 MIL/uL (ref 3.87–5.11)
RDW: 13 % (ref 11.5–15.5)
WBC: 10.4 10*3/uL (ref 4.0–10.5)
nRBC: 0 % (ref 0.0–0.2)

## 2023-03-01 LAB — MAGNESIUM: Magnesium: 2.1 mg/dL (ref 1.7–2.4)

## 2023-03-01 NOTE — Progress Notes (Signed)
1 Day Post-Op   Subjective/Chief Complaint: Patient feels well.  Had some drainage from the umbilical incision but that is stopped.  Pain well-controlled.   Objective: Vital signs in last 24 hours: Temp:  [97.5 F (36.4 C)-97.9 F (36.6 C)] 97.7 F (36.5 C) (03/16 0512) Pulse Rate:  [47-96] 47 (03/16 0512) Resp:  [14-18] 18 (03/16 0512) BP: (111-128)/(68-81) 120/74 (03/16 0512) SpO2:  [97 %-100 %] 99 % (03/16 0512) Last BM Date : 02/27/23  Intake/Output from previous day: 03/15 0701 - 03/16 0700 In: 1695.9 [I.V.:1495.9; IV Piggyback:200] Out: 10 [Blood:10] Intake/Output this shift: No intake/output data recorded.  Abdomen: Port sites clean dry intact.  Dressing noted over the umbilicus removed.  No active bleeding.  No signs of peritonitis.  Lab Results:  Recent Labs    02/27/23 0136 03/01/23 0634  WBC 7.4 10.4  HGB 14.9 12.8  HCT 44.8 39.5  PLT 231 204   BMET Recent Labs    02/27/23 0136 03/01/23 0634  NA 136 138  K 3.8 4.4  CL 108 106  CO2 22 25  GLUCOSE 95 110*  BUN 8 11  CREATININE 1.13* 0.98  CALCIUM 8.5* 8.4*   PT/INR No results for input(s): "LABPROT", "INR" in the last 72 hours. ABG No results for input(s): "PHART", "HCO3" in the last 72 hours.  Invalid input(s): "PCO2", "PO2"  Studies/Results: DG ERCP  Result Date: 02/27/2023 CLINICAL DATA:  Choledocholithiasis EXAM: ERCP TECHNIQUE: Multiple spot images obtained with the fluoroscopic device and submitted for interpretation post-procedure. COMPARISON:  MRCP from earlier the same day FINDINGS: A series of fluoroscopic spot images document endoscopic cannulation and opacification of the CBD with passage of balloon catheter. Cystic duct is patent. Intrahepatic ducts are incompletely opacified, appearing decompressed centrally. IMPRESSION: Endoscopic CBD cannulation and intervention as above. Electronically Signed   By: Lucrezia Europe M.D.   On: 02/27/2023 15:16   MR ABDOMEN MRCP W WO CONTAST  Result  Date: 02/27/2023 CLINICAL DATA:  Evaluate for bile duct obstruction. EXAM: MRI ABDOMEN WITHOUT AND WITH CONTRAST (INCLUDING MRCP) TECHNIQUE: Multiplanar multisequence MR imaging of the abdomen was performed both before and after the administration of intravenous contrast. Heavily T2-weighted images of the biliary and pancreatic ducts were obtained, and three-dimensional MRCP images were rendered by post processing. CONTRAST:  83mL GADAVIST GADOBUTROL 1 MMOL/ML IV SOLN COMPARISON:  Gallbladder sonogram from earlier today FINDINGS: Lower chest: No acute findings. Hepatobiliary: Mild hepatic steatosis. No suspicious enhancing liver lesions. Multiple small stones are identified layering within the gallbladder measuring up to 4 mm. Upper limits of normal common bile duct measuring 6 mm. Filling defect within the distal CBD compatible with choledocholithiasis this measures 5 mm, image 50/9. Slightly more distal is a second tiny stone just before the ampulla measuring 3 mm, image 51/9. Pancreas: No mass, inflammatory changes, or other parenchymal abnormality identified. Spleen:  Within normal limits in size and appearance. Adrenals/Urinary Tract: No masses identified. No evidence of hydronephrosis. Stomach/Bowel: Visualized portions within the abdomen are unremarkable. Vascular/Lymphatic: No pathologically enlarged lymph nodes identified. No abdominal aortic aneurysm demonstrated. Other:  There is no ascites or focal fluid collections. Musculoskeletal: No suspicious bone lesions identified. IMPRESSION: 1. Gallstones and choledocholithiasis. Two stones within the distal common bile duct measure up to 5 mm. 2. No gallbladder wall thickening or pericholecystic fluid noted. 3. Mild hepatic steatosis. Electronically Signed   By: Kerby Moors M.D.   On: 02/27/2023 09:22   MR 3D Recon At Scanner  Result Date: 02/27/2023 CLINICAL  DATA:  Evaluate for bile duct obstruction. EXAM: MRI ABDOMEN WITHOUT AND WITH CONTRAST (INCLUDING  MRCP) TECHNIQUE: Multiplanar multisequence MR imaging of the abdomen was performed both before and after the administration of intravenous contrast. Heavily T2-weighted images of the biliary and pancreatic ducts were obtained, and three-dimensional MRCP images were rendered by post processing. CONTRAST:  106mL GADAVIST GADOBUTROL 1 MMOL/ML IV SOLN COMPARISON:  Gallbladder sonogram from earlier today FINDINGS: Lower chest: No acute findings. Hepatobiliary: Mild hepatic steatosis. No suspicious enhancing liver lesions. Multiple small stones are identified layering within the gallbladder measuring up to 4 mm. Upper limits of normal common bile duct measuring 6 mm. Filling defect within the distal CBD compatible with choledocholithiasis this measures 5 mm, image 50/9. Slightly more distal is a second tiny stone just before the ampulla measuring 3 mm, image 51/9. Pancreas: No mass, inflammatory changes, or other parenchymal abnormality identified. Spleen:  Within normal limits in size and appearance. Adrenals/Urinary Tract: No masses identified. No evidence of hydronephrosis. Stomach/Bowel: Visualized portions within the abdomen are unremarkable. Vascular/Lymphatic: No pathologically enlarged lymph nodes identified. No abdominal aortic aneurysm demonstrated. Other:  There is no ascites or focal fluid collections. Musculoskeletal: No suspicious bone lesions identified. IMPRESSION: 1. Gallstones and choledocholithiasis. Two stones within the distal common bile duct measure up to 5 mm. 2. No gallbladder wall thickening or pericholecystic fluid noted. 3. Mild hepatic steatosis. Electronically Signed   By: Kerby Moors M.D.   On: 02/27/2023 09:22    Anti-infectives: Anti-infectives (From admission, onward)    Start     Dose/Rate Route Frequency Ordered Stop   02/28/23 1353  ciprofloxacin (CIPRO) 400 MG/200ML IVPB  Status:  Discontinued       Note to Pharmacy: Karsten Ro: cabinet override      02/28/23 1353  02/28/23 1400   02/28/23 1330  ciprofloxacin (CIPRO) 400 MG/200ML IVPB       Note to Pharmacy: Luciana Axe K: cabinet override      02/28/23 1330 02/28/23 1420   02/28/23 0600  piperacillin-tazobactam (ZOSYN) IVPB 3.375 g  Status:  Discontinued        3.375 g 12.5 mL/hr over 240 Minutes Intravenous On call 02/27/23 1544 02/27/23 1557   02/27/23 1415  ciprofloxacin (CIPRO) IVPB 400 mg        400 mg 200 mL/hr over 60 Minutes Intravenous  Once 02/27/23 1408 02/27/23 1515       Assessment/Plan: s/p Procedure(s): LAPAROSCOPIC CHOLECYSTECTOMY (N/A) Okay for discharge from surgery standpoint.  Follow-up instructions given.  LOS: 2 days    Turner Daniels MD 03/01/2023

## 2023-03-01 NOTE — Discharge Summary (Signed)
Physician Discharge Summary   INES TOENNIES [id number] DOB: [birth year] DOA: 02/27/2023  PCP: Sueanne Margarita, DO  Admit date: 02/27/2023 Discharge date: 03/01/2023   Admitted From: Home Disposition:  Home Discharging physician: Dwyane Dee, MD Barriers to discharge: none  Recommendations for Outpatient Follow-up:  Consider repeat of LFTs Follow up with general surgery    Discharge Condition: stable CODE STATUS: Full Diet recommendation:  Diet Orders (From admission, onward)     Start     Ordered   03/01/23 0000  Diet general        03/01/23 1022   02/28/23 2216  Diet regular Room service appropriate? Yes; Fluid consistency: Thin  Diet effective now       Question Answer Comment  Room service appropriate? Yes   Fluid consistency: Thin      02/28/23 2216            Hospital Course: Ms. Braniff is a 47 yo female with PMH GERD, anemia who presented with 3 to 4-day complaints of right-sided abdominal pain.  She was having associated nausea and some vomiting on Monday.  She had no prior symptoms of similar in the past.  She denied any lethargy, fevers, chills. Multiple imaging studies were performed including right upper quadrant ultrasound followed by MRCP.  She was ultimately found to have cholelithiasis and choledocholithiasis with 2 CBD stones noted.  There was no gallbladder wall thickening or pericholecystic fluid noted. She underwent evaluations by GI and general surgery. ERCP was performed on 02/27/2023 with stone removal.  She then underwent evaluation with general surgery for plans for cholecystectomy on 02/28/2023.  She tolerated surgery well and LFTs showed improvement prior to discharge.  She was able to tolerate a diet and was considered stable for discharging home the morning after surgery.  She will follow-up outpatient with general surgery.  Assessment and Plan: * Choledocholithiasis - Patient underwent RUQ U/S and MRCP.  Findings consistent with  choledocholithiasis -Patient had abdominal pain with associated nausea and vomiting.  No fevers, chills, lethargy - LFTs elevated consistent with obstructive pattern on admission -Patient underwent ERCP on 02/27/2023 -Underwent cholecystectomy on 02/28/2023 and tolerated well - LFTs showed signs of improvement on 03/01/2023 when checked prior to discharge  GERD (gastroesophageal reflux disease) - continue PPI  Crohn's disease ([place]) - Possible Crohn's however her last colonoscopy June 2015 was negative for signs of IBD -Unclear significance or validity of this problem on PMH   The patient's chronic medical conditions were treated accordingly per the patient's home medication regimen except as noted.  On day of discharge, patient was felt deemed stable for discharge. Patient/family member advised to call PCP or come back to ER if needed.   Principal Diagnosis: Choledocholithiasis  Discharge Diagnoses: Active Hospital Problems   Diagnosis Date Noted   Choledocholithiasis 02/27/2023    Priority: 1.   GERD (gastroesophageal reflux disease) 02/27/2023   Crohn's disease ([place]) 07/05/2013    Resolved Hospital Problems  No resolved problems to display.     Discharge Instructions     Diet general   Complete by: As directed    Increase activity slowly   Complete by: As directed       Allergies as of 03/01/2023       Reactions   Cortisone Other (See Comments)   Pain, not effective    Tramadol Itching   Hydrocodone Rash   not hives, no sob   Iron Other (See Comments)   HEADACHE   Oxycodone Rash  Sulfa Antibiotics Itching        Medication List     TAKE these medications    acetaminophen 650 MG CR tablet Commonly known as: TYLENOL Take 1,300 mg by mouth 2 (two) times daily as needed for pain.   hydrOXYzine 25 MG capsule Commonly known as: VISTARIL Take 50 mg by mouth at bedtime as needed (sleep).   meloxicam 15 MG tablet Commonly known as: MOBIC Take 15 mg by  mouth as needed for pain.   Mirena (52 MG) 20 MCG/DAY Iud Generic drug: levonorgestrel 1 each by Intrauterine route continuous.   pantoprazole 40 MG tablet Commonly known as: PROTONIX Take 40 mg by mouth 2 (two) times daily as needed (reflux).   SUMAtriptan 50 MG tablet Commonly known as: IMITREX Take 50 mg by mouth every 2 (two) hours as needed for migraine or headache.        Follow-up Information     Maczis, Carlena Hurl, [place]. Go on 03/20/2023.   Specialty: General Surgery Why: 03/20/23 at 4 pm. Please arrive 30 minutes early to complete check in, and bring photo ID and insurance card. Contact information: [place] [address] [phone]                Allergies  Allergen Reactions   Cortisone Other (See Comments)    Pain, not effective    Tramadol Itching   Hydrocodone Rash    not hives, no sob   Iron Other (See Comments)    HEADACHE   Oxycodone Rash   Sulfa Antibiotics Itching    Consultations: GI General surgery  Procedures: 02/27/2023: ERCP with stone stent and sphincterotomy 02/28/2023: Cholecystectomy  Discharge Exam: BP 120/74 (BP Location: Left Arm)   Pulse (!) 47   Temp 97.7 F (36.5 C) (Oral)   Resp 18   Ht 5\' 4"  (1.626 m)   Wt 59 kg   SpO2 99%   BMI 22.31 kg/m  Physical Exam Constitutional:      General: She is not in acute distress.    Appearance: She is well-developed.  HENT:     Head: Normocephalic and atraumatic.     Mouth/Throat:     Mouth: Mucous membranes are moist.  Eyes:     Extraocular Movements: Extraocular movements intact.  Cardiovascular:     Rate and Rhythm: Normal rate and regular rhythm.  Pulmonary:     Effort: Pulmonary effort is normal.     Breath sounds: Normal breath sounds.  Abdominal:     General: Bowel sounds are normal. There is no distension.     Palpations: Abdomen is soft.     Tenderness: There is no abdominal tenderness.   Musculoskeletal:        General: Normal range of motion.     Cervical back: Normal range of motion and neck supple.  Skin:    General: Skin is warm and dry.  Neurological:     General: No focal deficit present.     Mental Status: She is alert and oriented to person, place, and time.  Psychiatric:        Mood and Affect: Mood normal.        Behavior: Behavior normal.      The results of significant diagnostics from this hospitalization (including imaging, microbiology, ancillary and laboratory) are listed below for reference.   Microbiology: Recent Results (from the past 240 hour(s))  Surgical pcr screen     Status: Abnormal  Collection Time: 02/27/23  9:18 PM   Specimen: Nasal Mucosa; Nasal Swab  Result Value Ref Range Status   MRSA, PCR NEGATIVE NEGATIVE Final   Staphylococcus aureus POSITIVE (A) NEGATIVE Final    Comment: (NOTE) The Xpert SA Assay (FDA approved for NASAL specimens in patients 71 years of age and older), is one component of a comprehensive surveillance program. It is not intended to diagnose infection nor to guide or monitor treatment. Performed at [facility], [place] [address], [place], [place] [address]      Labs: BNP (last 3 results) No results for input(s): "BNP" in the last 8760 hours. Basic Metabolic Panel: Recent Labs  Lab 02/27/23 0136 03/01/23 0634  NA 136 138  K 3.8 4.4  CL 108 106  CO2 22 25  GLUCOSE 95 110*  BUN 8 11  CREATININE 1.13* 0.98  CALCIUM 8.5* 8.4*  MG  --  2.1   Liver Function Tests: Recent Labs  Lab 02/27/23 0136 03/01/23 0634  AST 317* 48*  ALT 174* 101*  ALKPHOS 132* 103  BILITOT 2.2* 0.9  PROT 6.7 5.8*  ALBUMIN 3.8 3.3*   Recent Labs  Lab 02/27/23 0136  LIPASE 52*   No results for input(s): "AMMONIA" in the last 168 hours. CBC: Recent Labs  Lab 02/27/23 0136 03/01/23 0634  WBC 7.4 10.4  NEUTROABS 5.0 8.8*  HGB 14.9 12.8  HCT 44.8 39.5  MCV 86.5 88.6  PLT 231 204   Cardiac  Enzymes: No results for input(s): "CKTOTAL", "CKMB", "CKMBINDEX", "TROPONINI" in the last 168 hours. BNP: Invalid input(s): "POCBNP" CBG: No results for input(s): "GLUCAP" in the last 168 hours. D-Dimer No results for input(s): "DDIMER" in the last 72 hours. Hgb A1c No results for input(s): "HGBA1C" in the last 72 hours. Lipid Profile No results for input(s): "CHOL", "HDL", "LDLCALC", "TRIG", "CHOLHDL", "LDLDIRECT" in the last 72 hours. Thyroid function studies No results for input(s): "TSH", "T4TOTAL", "T3FREE", "THYROIDAB" in the last 72 hours.  Invalid input(s): "FREET3" Anemia work up No results for input(s): "VITAMINB12", "FOLATE", "FERRITIN", "TIBC", "IRON", "RETICCTPCT" in the last 72 hours. Urinalysis    Component Value Date/Time   COLORURINE AMBER (A) 02/27/2023 0336   APPEARANCEUR CLEAR 02/27/2023 0336   LABSPEC 1.018 02/27/2023 0336   PHURINE 5.0 02/27/2023 0336   GLUCOSEU NEGATIVE 02/27/2023 0336   HGBUR NEGATIVE 02/27/2023 0336   BILIRUBINUR NEGATIVE 02/27/2023 0336   KETONESUR NEGATIVE 02/27/2023 0336   PROTEINUR NEGATIVE 02/27/2023 0336   UROBILINOGEN 0.2 08/28/2009 2054   NITRITE NEGATIVE 02/27/2023 0336   LEUKOCYTESUR NEGATIVE 02/27/2023 0336   Sepsis Labs Recent Labs  Lab 02/27/23 0136 03/01/23 0634  WBC 7.4 10.4   Microbiology Recent Results (from the past 240 hour(s))  Surgical pcr screen     Status: Abnormal   Collection Time: 02/27/23  9:18 PM   Specimen: Nasal Mucosa; Nasal Swab  Result Value Ref Range Status   MRSA, PCR NEGATIVE NEGATIVE Final   Staphylococcus aureus POSITIVE (A) NEGATIVE Final    Comment: (NOTE) The Xpert SA Assay (FDA approved for NASAL specimens in patients 95 years of age and older), is one component of a comprehensive surveillance program. It is not intended to diagnose infection nor to guide or monitor treatment. Performed at [facility], [place] [address] [place], [place] [address]      Procedures/Studies: DG ERCP  Result Date: 02/27/2023 CLINICAL DATA:  Choledocholithiasis EXAM: ERCP TECHNIQUE: Multiple spot images obtained with the fluoroscopic device and submitted  for interpretation post-procedure. COMPARISON:  MRCP from earlier the same day FINDINGS: A series of fluoroscopic spot images document endoscopic cannulation and opacification of the CBD with passage of balloon catheter. Cystic duct is patent. Intrahepatic ducts are incompletely opacified, appearing decompressed centrally. IMPRESSION: Endoscopic CBD cannulation and intervention as above. Electronically Signed   By: Lucrezia Europe M.D.   On: 02/27/2023 15:16   MR ABDOMEN MRCP W WO CONTAST  Result Date: 02/27/2023 CLINICAL DATA:  Evaluate for bile duct obstruction. EXAM: MRI ABDOMEN WITHOUT AND WITH CONTRAST (INCLUDING MRCP) TECHNIQUE: Multiplanar multisequence MR imaging of the abdomen was performed both before and after the administration of intravenous contrast. Heavily T2-weighted images of the biliary and pancreatic ducts were obtained, and three-dimensional MRCP images were rendered by post processing. CONTRAST:  55mL GADAVIST GADOBUTROL 1 MMOL/ML IV SOLN COMPARISON:  Gallbladder sonogram from earlier today FINDINGS: Lower chest: No acute findings. Hepatobiliary: Mild hepatic steatosis. No suspicious enhancing liver lesions. Multiple small stones are identified layering within the gallbladder measuring up to 4 mm. Upper limits of normal common bile duct measuring 6 mm. Filling defect within the distal CBD compatible with choledocholithiasis this measures 5 mm, image 50/9. Slightly more distal is a second tiny stone just before the ampulla measuring 3 mm, image 51/9. Pancreas: No mass, inflammatory changes, or other parenchymal abnormality identified. Spleen:  Within normal limits in size and appearance. Adrenals/Urinary Tract: No masses identified. No evidence of hydronephrosis. Stomach/Bowel: Visualized portions within the  abdomen are unremarkable. Vascular/Lymphatic: No pathologically enlarged lymph nodes identified. No abdominal aortic aneurysm demonstrated. Other:  There is no ascites or focal fluid collections. Musculoskeletal: No suspicious bone lesions identified. IMPRESSION: 1. Gallstones and choledocholithiasis. Two stones within the distal common bile duct measure up to 5 mm. 2. No gallbladder wall thickening or pericholecystic fluid noted. 3. Mild hepatic steatosis. Electronically Signed   By: Kerby Moors M.D.   On: 02/27/2023 09:22   MR 3D Recon At Scanner  Result Date: 02/27/2023 CLINICAL DATA:  Evaluate for bile duct obstruction. EXAM: MRI ABDOMEN WITHOUT AND WITH CONTRAST (INCLUDING MRCP) TECHNIQUE: Multiplanar multisequence MR imaging of the abdomen was performed both before and after the administration of intravenous contrast. Heavily T2-weighted images of the biliary and pancreatic ducts were obtained, and three-dimensional MRCP images were rendered by post processing. CONTRAST:  66mL GADAVIST GADOBUTROL 1 MMOL/ML IV SOLN COMPARISON:  Gallbladder sonogram from earlier today FINDINGS: Lower chest: No acute findings. Hepatobiliary: Mild hepatic steatosis. No suspicious enhancing liver lesions. Multiple small stones are identified layering within the gallbladder measuring up to 4 mm. Upper limits of normal common bile duct measuring 6 mm. Filling defect within the distal CBD compatible with choledocholithiasis this measures 5 mm, image 50/9. Slightly more distal is a second tiny stone just before the ampulla measuring 3 mm, image 51/9. Pancreas: No mass, inflammatory changes, or other parenchymal abnormality identified. Spleen:  Within normal limits in size and appearance. Adrenals/Urinary Tract: No masses identified. No evidence of hydronephrosis. Stomach/Bowel: Visualized portions within the abdomen are unremarkable. Vascular/Lymphatic: No pathologically enlarged lymph nodes identified. No abdominal aortic  aneurysm demonstrated. Other:  There is no ascites or focal fluid collections. Musculoskeletal: No suspicious bone lesions identified. IMPRESSION: 1. Gallstones and choledocholithiasis. Two stones within the distal common bile duct measure up to 5 mm. 2. No gallbladder wall thickening or pericholecystic fluid noted. 3. Mild hepatic steatosis. Electronically Signed   By: Kerby Moors M.D.   On: 02/27/2023 09:22   US Abdomen Limited RUQ (  LIVER/GB)  Result Date: 02/27/2023 CLINICAL DATA:  Right upper quadrant pain EXAM: ULTRASOUND ABDOMEN LIMITED RIGHT UPPER QUADRANT COMPARISON:  None Available. FINDINGS: Gallbladder: Multiple stones are identified within the gallbladder. These measure up to 6 mm in diameter. Gallbladder wall is upper limits of normal at 3 mm. No pericholecystic fluid or sonographic Murphy's sign. Common bile duct: Diameter: 7 mm. Filling defects within the CBD are noted concerning for choledocholithiasis. Liver: No focal lesion identified. Within normal limits in parenchymal echogenicity. Portal vein is patent on color Doppler imaging with normal direction of blood flow towards the liver. Other: None. IMPRESSION: 1. Gallstones. Gallbladder wall thickness is upper limits of normal at 3 mm. No pericholecystic fluid or sonographic Murphy's sign. 2. Common bile duct is dilated to 7 mm. Filling defects are noted within the CBD concerning for choledocholithiasis. MRI/MRCP may be helpful to confirm. Electronically Signed   By: Kerby Moors M.D.   On: 02/27/2023 06:26   DG Chest 2 View  Result Date: 02/27/2023 CLINICAL DATA:  Shortness of breath and chest pain EXAM: CHEST - 2 VIEW COMPARISON:  07/25/2016 FINDINGS: The heart size and mediastinal contours are within normal limits. Both lungs are clear. The visualized skeletal structures are unremarkable. IMPRESSION: No active cardiopulmonary disease. Electronically Signed   By: Placido Sou M.D.   On: 02/27/2023 01:53     Time coordinating  discharge: Over 30 minutes    Dwyane Dee, MD  [facility] 03/01/2023, 3:21 PM

## 2023-03-03 LAB — SURGICAL PATHOLOGY
